# Patient Record
Sex: Female | Born: 1945 | Race: White | Hispanic: No | Marital: Married | Smoking: Current every day smoker
Health system: Southern US, Community
[De-identification: ages and names within clinical notes are randomized; demographics above are authoritative.]

## PROBLEM LIST (undated history)

## (undated) DIAGNOSIS — J449 Chronic obstructive pulmonary disease, unspecified: Secondary | ICD-10-CM

## (undated) DIAGNOSIS — R002 Palpitations: Secondary | ICD-10-CM

## (undated) DIAGNOSIS — M1990 Unspecified osteoarthritis, unspecified site: Secondary | ICD-10-CM

## (undated) DIAGNOSIS — I2510 Atherosclerotic heart disease of native coronary artery without angina pectoris: Secondary | ICD-10-CM

## (undated) DIAGNOSIS — E785 Hyperlipidemia, unspecified: Secondary | ICD-10-CM

## (undated) DIAGNOSIS — M069 Rheumatoid arthritis, unspecified: Secondary | ICD-10-CM

## (undated) DIAGNOSIS — I10 Essential (primary) hypertension: Secondary | ICD-10-CM

## (undated) DIAGNOSIS — K219 Gastro-esophageal reflux disease without esophagitis: Secondary | ICD-10-CM

## (undated) DIAGNOSIS — M797 Fibromyalgia: Secondary | ICD-10-CM

## (undated) DIAGNOSIS — E079 Disorder of thyroid, unspecified: Secondary | ICD-10-CM

## (undated) HISTORY — DX: Essential (primary) hypertension: I10

## (undated) HISTORY — DX: Palpitations: R00.2

## (undated) HISTORY — DX: Chronic obstructive pulmonary disease, unspecified: J44.9

## (undated) HISTORY — DX: Atherosclerotic heart disease of native coronary artery without angina pectoris: I25.10

## (undated) HISTORY — DX: Disorder of thyroid, unspecified: E07.9

## (undated) HISTORY — DX: Unspecified osteoarthritis, unspecified site: M19.90

## (undated) HISTORY — PX: PTCA: SHX146

## (undated) HISTORY — DX: Hyperlipidemia, unspecified: E78.5

## (undated) HISTORY — DX: Gastro-esophageal reflux disease without esophagitis: K21.9

## (undated) HISTORY — DX: Fibromyalgia: M79.7

## (undated) HISTORY — DX: Rheumatoid arthritis, unspecified: M06.9

## (undated) HISTORY — PX: BACK SURGERY: SHX140

---

## 1999-04-26 ENCOUNTER — Encounter: Payer: Self-pay | Admitting: Emergency Medicine

## 1999-04-27 ENCOUNTER — Encounter: Payer: Self-pay | Admitting: Cardiovascular Disease

## 1999-04-27 ENCOUNTER — Observation Stay (HOSPITAL_COMMUNITY): Admission: EM | Admit: 1999-04-27 | Discharge: 1999-04-27 | Payer: Self-pay | Admitting: Emergency Medicine

## 2004-05-14 ENCOUNTER — Emergency Department: Payer: Self-pay | Admitting: Emergency Medicine

## 2005-10-19 ENCOUNTER — Encounter: Admission: RE | Admit: 2005-10-19 | Discharge: 2005-10-19 | Payer: Self-pay | Admitting: Orthopedic Surgery

## 2005-10-20 ENCOUNTER — Encounter (INDEPENDENT_AMBULATORY_CARE_PROVIDER_SITE_OTHER): Payer: Self-pay | Admitting: *Deleted

## 2005-10-20 ENCOUNTER — Ambulatory Visit (HOSPITAL_BASED_OUTPATIENT_CLINIC_OR_DEPARTMENT_OTHER): Admission: RE | Admit: 2005-10-20 | Discharge: 2005-10-20 | Payer: Self-pay | Admitting: Orthopedic Surgery

## 2005-10-22 ENCOUNTER — Emergency Department (HOSPITAL_COMMUNITY): Admission: EM | Admit: 2005-10-22 | Discharge: 2005-10-23 | Payer: Self-pay | Admitting: Emergency Medicine

## 2005-12-22 ENCOUNTER — Inpatient Hospital Stay (HOSPITAL_BASED_OUTPATIENT_CLINIC_OR_DEPARTMENT_OTHER): Admission: RE | Admit: 2005-12-22 | Discharge: 2005-12-22 | Payer: Self-pay | Admitting: Cardiovascular Disease

## 2006-01-17 ENCOUNTER — Encounter: Admission: RE | Admit: 2006-01-17 | Discharge: 2006-01-17 | Payer: Self-pay | Admitting: Neurological Surgery

## 2006-03-23 ENCOUNTER — Ambulatory Visit (HOSPITAL_COMMUNITY): Admission: RE | Admit: 2006-03-23 | Discharge: 2006-03-23 | Payer: Self-pay | Admitting: Pulmonary Disease

## 2008-03-25 ENCOUNTER — Encounter: Admission: RE | Admit: 2008-03-25 | Discharge: 2008-03-25 | Payer: Self-pay | Admitting: Orthopedic Surgery

## 2008-03-26 ENCOUNTER — Emergency Department: Payer: Self-pay | Admitting: Emergency Medicine

## 2008-04-06 ENCOUNTER — Inpatient Hospital Stay (HOSPITAL_BASED_OUTPATIENT_CLINIC_OR_DEPARTMENT_OTHER): Admission: RE | Admit: 2008-04-06 | Discharge: 2008-04-06 | Payer: Self-pay | Admitting: Cardiovascular Disease

## 2008-04-07 ENCOUNTER — Ambulatory Visit (HOSPITAL_COMMUNITY): Admission: RE | Admit: 2008-04-07 | Discharge: 2008-04-08 | Payer: Self-pay | Admitting: Cardiovascular Disease

## 2008-06-08 ENCOUNTER — Observation Stay (HOSPITAL_COMMUNITY): Admission: EM | Admit: 2008-06-08 | Discharge: 2008-06-10 | Payer: Self-pay | Admitting: Emergency Medicine

## 2008-08-12 ENCOUNTER — Emergency Department (HOSPITAL_COMMUNITY): Admission: EM | Admit: 2008-08-12 | Discharge: 2008-08-12 | Payer: Self-pay | Admitting: Emergency Medicine

## 2008-08-28 ENCOUNTER — Ambulatory Visit (HOSPITAL_COMMUNITY): Admission: RE | Admit: 2008-08-28 | Discharge: 2008-08-29 | Payer: Self-pay | Admitting: Cardiovascular Disease

## 2008-09-07 ENCOUNTER — Ambulatory Visit: Payer: Self-pay | Admitting: Vascular Surgery

## 2009-07-26 IMAGING — CT CT CERVICAL SPINE W/O CM
3 of 4 series · 16 of 28 positions shown, 18 images · non-contrast
Comparison: MRI 01/17/2006.

CLINICAL DATA: Neck pain.  Multiple cervical spine surgeries.
Nonunion at C6-7.

CT CERVICAL SPINE WITHOUT CONTRAST
TECHNIQUE: Multidetector CT imaging of the cervical spine was
performed. Multiplanar CT image reconstructions were also
generated.

[Series 2: cervical spine · axial · 0.23mm/px · z∈[+88,+190]mm · 5 of 123 slices shown, 7 images]
[im 21/123  soft-tissue]
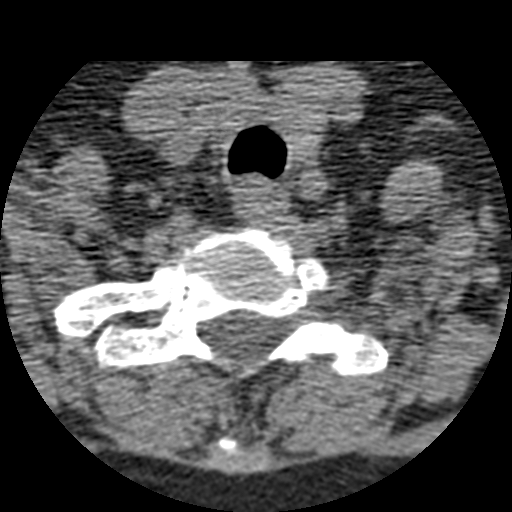
[im 21/123  bone]
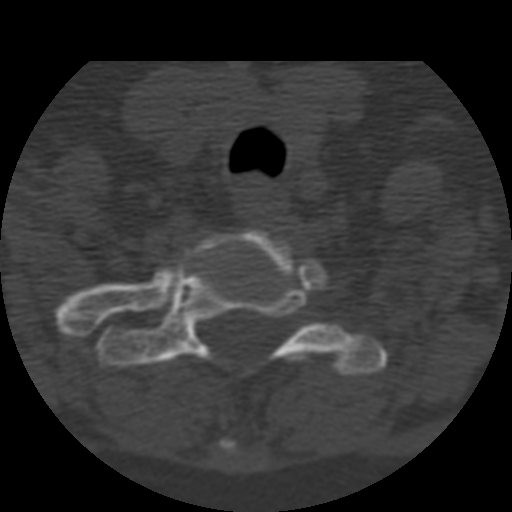
[im 41/123  bone]
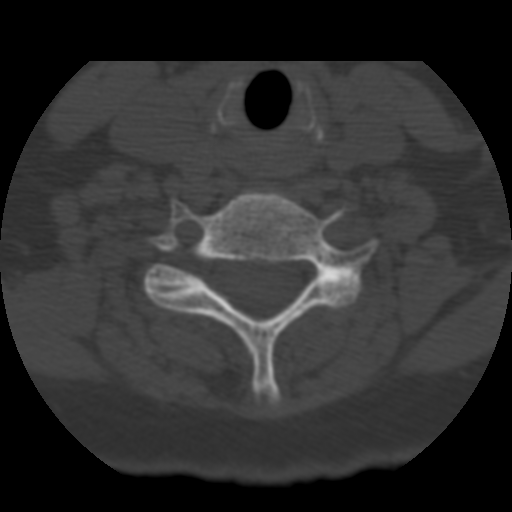
[im 62/123  bone]
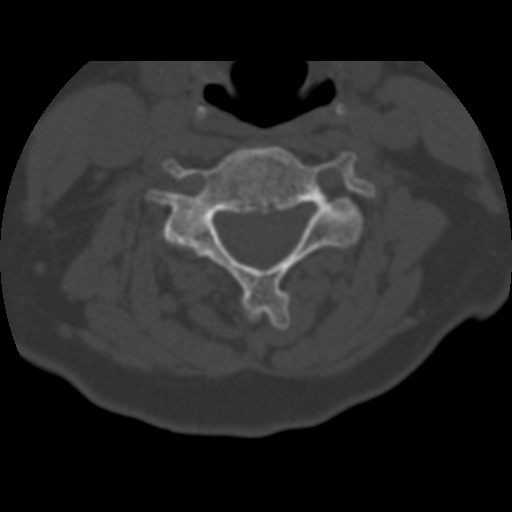
[im 82/123  bone]
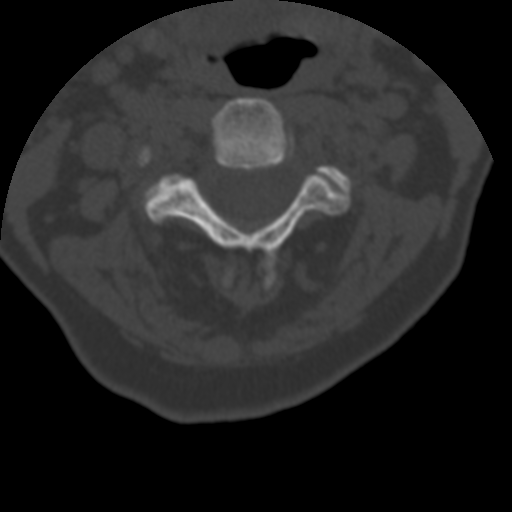
[im 102/123  soft-tissue]
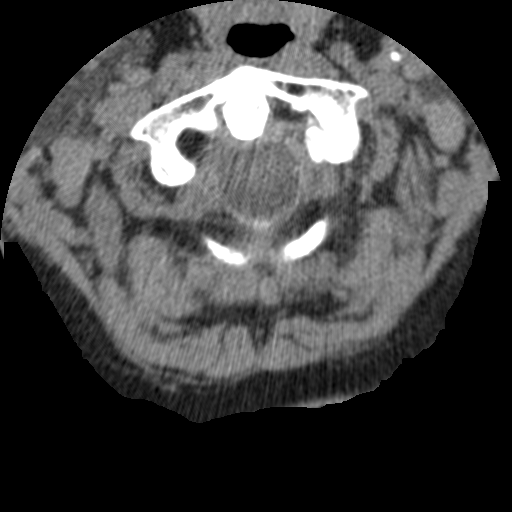
[im 102/123  bone]
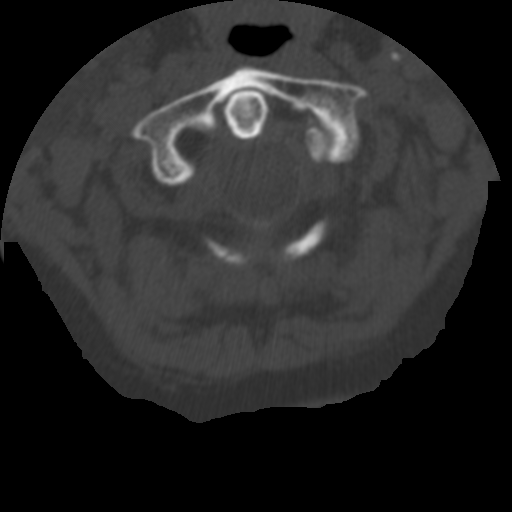

[Series 3: bone windows · axial · 0.23mm/px · z∈[+88,+190]mm · 5 of 123 slices shown]
[im 21/123  bone]
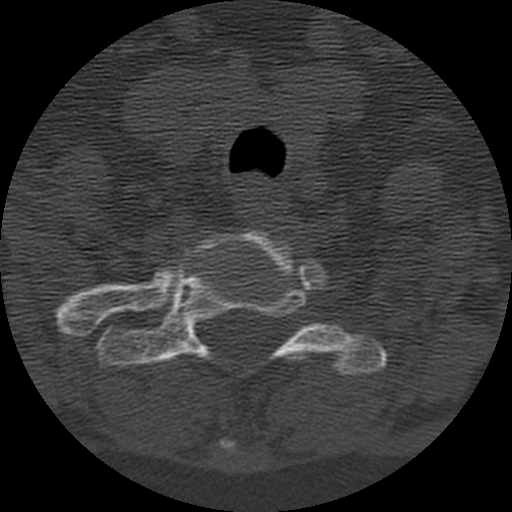
[im 41/123  bone]
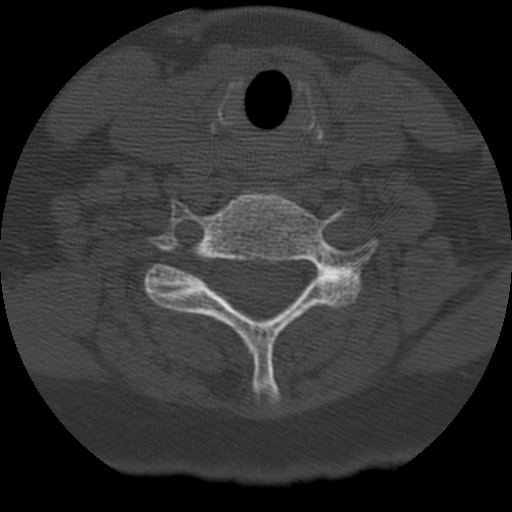
[im 62/123  bone]
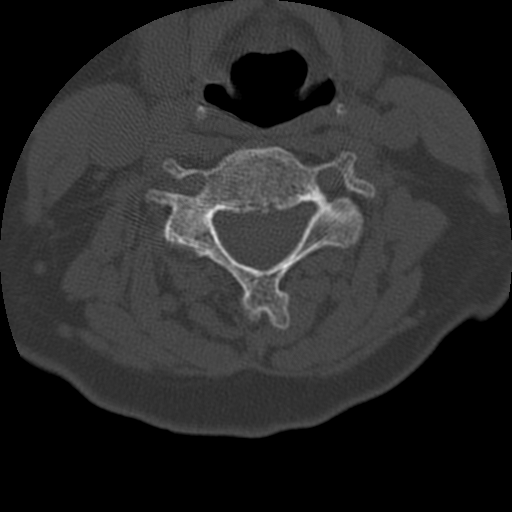
[im 82/123  bone]
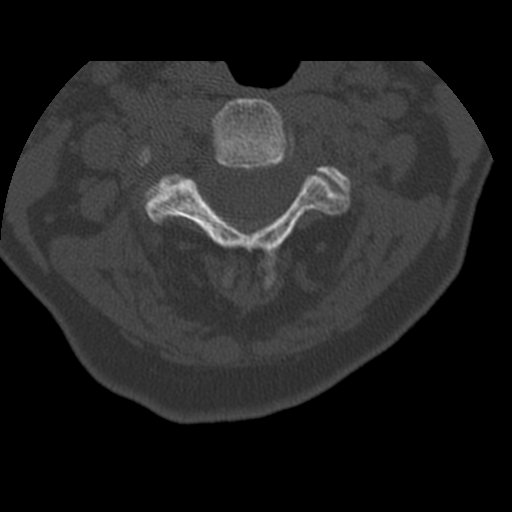
[im 102/123  bone]
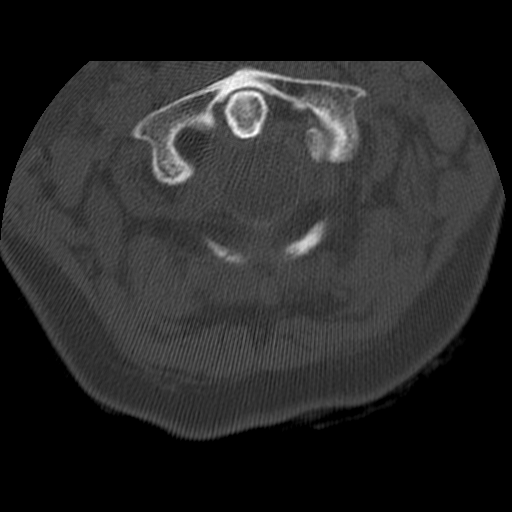

[Series 103: coronal · coronal · 0.31mm/px · 6 of 36 slices shown]
[im 6/36  bone]
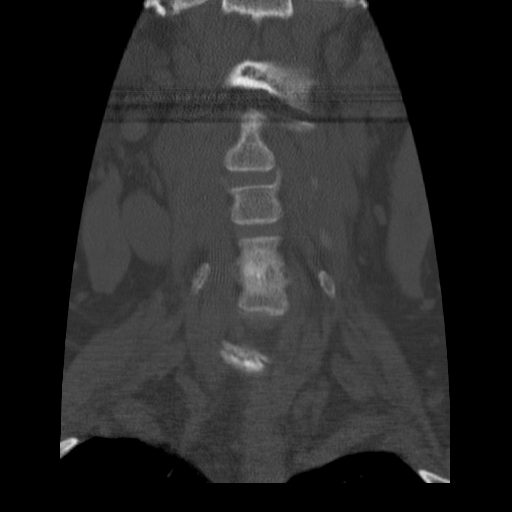
[im 7/36  soft-tissue]
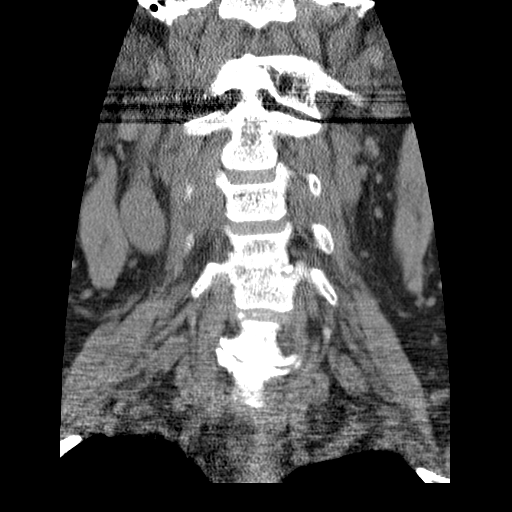
[im 12/36  bone]
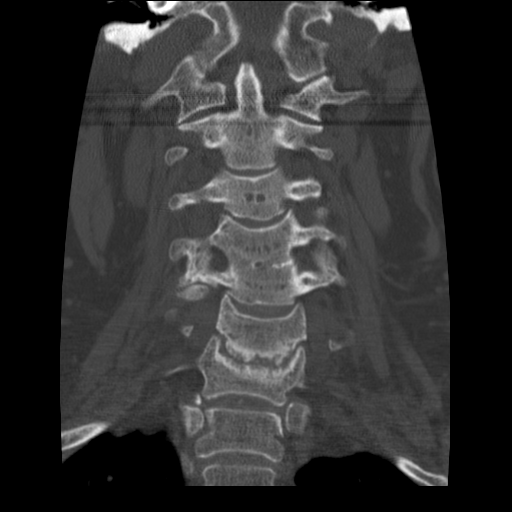
[im 18/36  bone]
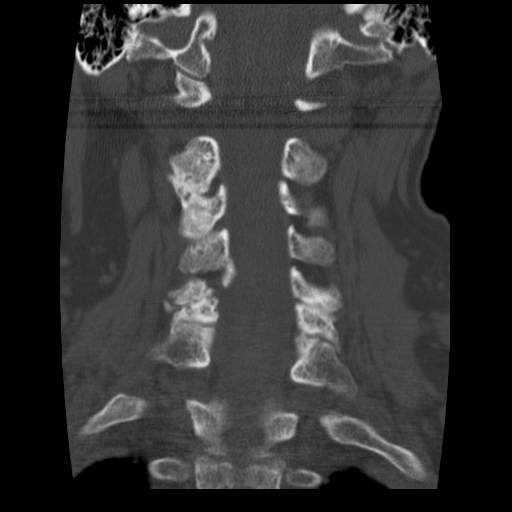
[im 24/36  bone]
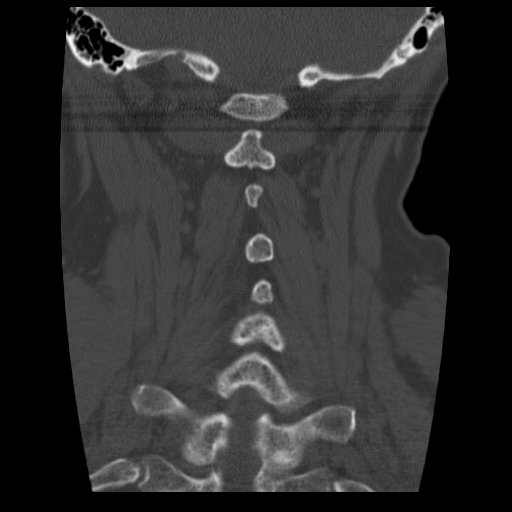
[im 30/36  bone]
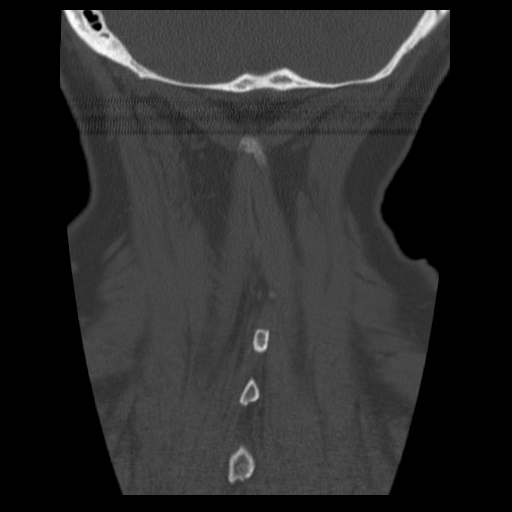

[16 of 28 positions shown; findings below may reference images not displayed]

FINDINGS: The patient is status post fusion at C4-5.  There is
nonunion at C6-7.  Slight anterolisthesis at C5-6 measures 2 mm.
Vertebral body heights and alignment are otherwise maintained.
There is fusion of the posterior elements on the right at C4-5 as
well.  The soft tissues are unremarkable.  Coronal reformats
demonstrate leftward convexity at C6-7.
IMPRESSION: 1.  Nonunion at C6-7 with extensive endplate degenerative changes
and leftward curvature.
2.  Post fusion at C4-5.  This includes the right posterior
elements.
3.  2 mm anterolisthesis at C5-6.

## 2010-04-20 ENCOUNTER — Ambulatory Visit: Payer: Self-pay | Admitting: Cardiovascular Disease

## 2010-06-05 ENCOUNTER — Encounter: Payer: Self-pay | Admitting: Orthopedic Surgery

## 2010-08-09 ENCOUNTER — Other Ambulatory Visit: Payer: Self-pay | Admitting: Cardiovascular Disease

## 2010-08-09 DIAGNOSIS — K219 Gastro-esophageal reflux disease without esophagitis: Secondary | ICD-10-CM

## 2010-08-09 MED ORDER — PRASUGREL HCL 10 MG PO TABS: 10.0000 mg | ORAL_TABLET | Freq: Every day | ORAL | Status: DC

## 2010-08-09 NOTE — Telephone Encounter (Signed)
Patient needs script for EFFIENT updated.  She needs QTY 90 or 3 month supply.  Patient takes script to [facility] in [place] on [address].  Please let patient know.

## 2010-08-09 NOTE — Telephone Encounter (Signed)
Pt requesting refill on   Effient 10mg  daily

## 2010-08-24 LAB — CARDIAC PANEL(CRET KIN+CKTOT+MB+TROPI)
Relative Index: INVALID (ref 0.0–2.5)
Total CK: 73 U/L (ref 7–177)
Troponin I: 0.15 ng/mL — ABNORMAL HIGH (ref 0.00–0.06)

## 2010-08-24 LAB — BASIC METABOLIC PANEL
BUN: 10 mg/dL (ref 6–23)
CO2: 30 mEq/L (ref 19–32)
Calcium: 9.1 mg/dL (ref 8.4–10.5)
GFR calc non Af Amer: >60 mL/min (ref >60)
Glucose, Bld: 93 mg/dL (ref 70–99)
Sodium: 140 mEq/L (ref 135–145)

## 2010-08-24 LAB — CBC
Hemoglobin: 12.2 g/dL (ref 12.0–15.0)
MCHC: 34.7 g/dL (ref 30.0–36.0)
Platelets: 203 10*3/uL (ref 150–400)
RDW: 13.3 % (ref 11.5–15.5)

## 2010-08-24 LAB — PROTIME-INR: INR: 0.8 (ref 0.00–1.49)

## 2010-08-24 LAB — APTT: aPTT: 28 seconds (ref 24–37)

## 2010-08-24 LAB — TROPONIN I: Troponin I: 0.17 ng/mL — ABNORMAL HIGH (ref 0.00–0.06)

## 2010-08-25 LAB — BASIC METABOLIC PANEL
BUN: 11 mg/dL (ref 6–23)
Calcium: 9.5 mg/dL (ref 8.4–10.5)
GFR calc non Af Amer: >60 mL/min (ref >60)
Glucose, Bld: 129 mg/dL — ABNORMAL HIGH (ref 70–99)
Potassium: 4 mEq/L (ref 3.5–5.1)
Sodium: 138 mEq/L (ref 135–145)

## 2010-08-25 LAB — APTT: aPTT: 30 seconds (ref 24–37)

## 2010-08-25 LAB — CBC
Hemoglobin: 13.3 g/dL (ref 12.0–15.0)
Platelets: 244 10*3/uL (ref 150–400)
RDW: 13.1 % (ref 11.5–15.5)
WBC: 7.8 10*3/uL (ref 4.0–10.5)

## 2010-08-25 LAB — DIFFERENTIAL
Basophils Absolute: 0.2 10*3/uL — ABNORMAL HIGH (ref 0.0–0.1)
Lymphocytes Relative: 20 % (ref 12–46)
Lymphs Abs: 1.5 10*3/uL (ref 0.7–4.0)
Neutro Abs: 5.5 10*3/uL (ref 1.7–7.7)

## 2010-08-25 LAB — PROTIME-INR: Prothrombin Time: 11.6 seconds (ref 11.6–15.2)

## 2010-08-29 LAB — CK TOTAL AND CKMB (NOT AT ARMC)
CK, MB: 1 ng/mL (ref 0.3–4.0)
Relative Index: INVALID (ref 0.0–2.5)
Total CK: 55 U/L (ref 7–177)
Total CK: 65 U/L (ref 7–177)

## 2010-08-29 LAB — COMPREHENSIVE METABOLIC PANEL
BUN: 12 mg/dL (ref 6–23)
CO2: 24 mEq/L (ref 19–32)
Calcium: 9.8 mg/dL (ref 8.4–10.5)
Creatinine, Ser: 0.77 mg/dL (ref 0.4–1.2)
GFR calc non Af Amer: >60 mL/min (ref >60)
Glucose, Bld: 110 mg/dL — ABNORMAL HIGH (ref 70–99)

## 2010-08-29 LAB — DIFFERENTIAL
Basophils Relative: 1 % (ref 0–1)
Lymphocytes Relative: 32 % (ref 12–46)
Lymphs Abs: 2.6 10*3/uL (ref 0.7–4.0)
Monocytes Relative: 6 % (ref 3–12)
Neutro Abs: 4.7 10*3/uL (ref 1.7–7.7)
Neutrophils Relative %: 58 % (ref 43–77)

## 2010-08-29 LAB — BASIC METABOLIC PANEL
BUN: 7 mg/dL (ref 6–23)
CO2: 28 mEq/L (ref 19–32)
Chloride: 105 mEq/L (ref 96–112)
GFR calc non Af Amer: >60 mL/min (ref >60)
Glucose, Bld: 106 mg/dL — ABNORMAL HIGH (ref 70–99)
Potassium: 4 mEq/L (ref 3.5–5.1)
Sodium: 139 mEq/L (ref 135–145)

## 2010-08-29 LAB — CBC
HCT: 32.5 % — ABNORMAL LOW (ref 36.0–46.0)
Hemoglobin: 11.2 g/dL — ABNORMAL LOW (ref 12.0–15.0)
Hemoglobin: 13 g/dL (ref 12.0–15.0)
MCHC: 33.4 g/dL (ref 30.0–36.0)
MCHC: 34.4 g/dL (ref 30.0–36.0)
MCV: 90.5 fL (ref 78.0–100.0)
Platelets: 252 10*3/uL (ref 150–400)
RBC: 3.6 MIL/uL — ABNORMAL LOW (ref 3.87–5.11)
RBC: 4.26 MIL/uL (ref 3.87–5.11)
RDW: 13.6 % (ref 11.5–15.5)
RDW: 13.8 % (ref 11.5–15.5)
WBC: 7.2 10*3/uL (ref 4.0–10.5)
WBC: 8.1 10*3/uL (ref 4.0–10.5)

## 2010-08-29 LAB — PROTIME-INR
INR: 0.9 (ref 0.00–1.49)
Prothrombin Time: 12.7 seconds (ref 11.6–15.2)

## 2010-08-29 LAB — TROPONIN I
Troponin I: 0.01 ng/mL (ref 0.00–0.06)
Troponin I: 0.02 ng/mL (ref 0.00–0.06)

## 2010-08-29 LAB — HEPARIN LEVEL (UNFRACTIONATED): Heparin Unfractionated: 0.41 IU/mL (ref 0.30–0.70)

## 2010-08-29 LAB — TSH: TSH: 2.052 u[IU]/mL (ref 0.350–4.500)

## 2010-08-29 LAB — CARDIAC PANEL(CRET KIN+CKTOT+MB+TROPI): Relative Index: INVALID (ref 0.0–2.5)

## 2010-09-27 NOTE — Cardiovascular Report (Signed)
NAMEHECTOR, VENNE NO.:  [id number]   MEDICAL RECORD NO.:  [id number]          PATIENT TYPE:  OIB   LOCATION:  1965                         FACILITY:  [facility]   PHYSICIAN:  Vesta Mixer, M.D. DATE OF BIRTH:  11-04-1945   DATE OF PROCEDURE:  04/06/2008  DATE OF DISCHARGE:  04/06/2008                            CARDIAC CATHETERIZATION   Jasmin [name] is a 65 year old female with a history of coronary artery  disease.  She is status post PTCA of her diagonal artery many years ago.  She is continued to smoke.  She presents now with episodes of chest pain  and scheduled for heart catheterization.   The procedure was left heart catheterization with coronary angiography.  2 mg of Versed was given before the case and 200 mcg of IC nitroglycerin  was given during the case.   The right femoral artery was easily cannulated using a modified  Seldinger technique.   HEMODYNAMICS:  LV pressure is 152/27 with an aortic pressure of 147/70.   ANGIOGRAPHY:  Left main:  The left main is smooth and normal.   The left anterior descending artery has mild irregularities.  It reaches  around the apex.   The first diagonal artery is a very large vessel.  There is a tight 80%  stenosis at its midpoint.  The vessel lumen enlarged some after IC  nitroglycerin.   The left circumflex artery is a moderate-sized vessel.  The first obtuse  marginal artery is very tiny.  There is a 50% stenosis just prior to  giving off the second obtuse marginal artery.   The right coronary artery is very large.  There are mild irregularities.  There is a 60-70% stenosis just after the takeoff of the posterior  descending artery.  It is not clear that this is hemodynamically  significant, although it certainly could be.   The left ventriculogram was performed in the 30 RAO position.  It  reveals overall normal left ventricular systolic function.  Ejection  fraction is around 70-75%.   COMPLICATIONS:  None.   CONCLUSION:  Coronary artery disease primarily involving the first  diagonal artery.  She does also have moderate disease in the circumflex  artery and right coronary artery.  We will bring her back for PCI of the  first diagonal artery.  We may need angioplasty of some of the other  lesions that she continues to have chest pain.      Vesta Mixer, M.D.  Electronically Signed     PJN/MEDQ  D:  04/06/2008  T:  04/07/2008  Job:  [id number]

## 2010-09-27 NOTE — Cardiovascular Report (Signed)
NAMESABRINE, PATCHEN NO.:  [id number]   MEDICAL RECORD NO.:  [id number]          PATIENT TYPE:  OIB   LOCATION:  2502                         FACILITY:  [facility]   PHYSICIAN:  Vesta Mixer, M.D. DATE OF BIRTH:  09-13-1945   DATE OF PROCEDURE:  08/28/2008  DATE OF DISCHARGE:                            CARDIAC CATHETERIZATION   Jasmin [name] is a 65 year old female with a history of coronary artery  disease.  She has had numerous interventions on her diagonal artery for  restenosis.  She presents yesterday to the office with symptoms of  angina.  She was admitted today for outpatient heart catheterization and  possible angioplasty.   The procedure was left heart catheterization with coronary angiography  with PTCA and angioscoping of the first diagonal artery.   The right femoral artery was easily cannulated using modified Seldinger  technique.   HEMODYNAMICS:  LV pressure is 155/14 with an aortic pressure of 149/71.   ANGIOGRAPHY:  The left main is normal.   The left anterior descending artery has minor luminal irregularities.  The first diagonal artery is a moderate-sized vessel.  There is a stent  in the proximal aspect of this vessel.  Just proximal to the stent,  there is a approximately 60% stenosis.  Within the stent there is a 90%  stenosis and this 90% stenosis extends out to just distal to the actual  stent.   There is a small branch that originates from this diagonal artery just  prior to the stent.  There is an 70-80% stenosis in the takeoff of the  small branch.   The left circumflex artery has minor luminal irregularities.   The right coronary artery is smooth and normal.   The left ventriculogram was not performed.  We crossed the valve  pressures.   PCI.  The patient was given Angiomax.  The ACT was 267.  The left main  was engaged using a Judkins left 4 guide.  A Prowater angioplasty wire  was used to place down into the diagonal  artery.  Intracoronary  nitroglycerin was given.   A 2.5 x 10 mm angioscope balloon was positioned across the entire  stenosis.  It was inflated up to 12 atmospheres for 40 seconds on three  different occasions.   We then positioned the angioscope distal to the stent and inflated up to  8 atmospheres on two occasions.   Following this, a 2.5 x 16-mm [place] Voyager was positioned in the distal  aspect of the stent.  It was inflated up to 12 atmospheres for 40  seconds.  It was then positioned in the proximal edge of the stent and  inflated up to 12 atmospheres.  It was then positioned just outside the  stent distally and inflated up to 6 atmospheres for 20 seconds and then  finally 8 atmospheres for 60 seconds.  This resulted in some improvement  of the vessel lumen.  There is still perhaps a 10% residual stenosis.   The patient is stable leaving the lab.   This diagonal artery has become quite problematic.  It is  a very small  vessel and I do not think that we could fit any other stents there.  It  is diffusely diseased proximal and distal to the stent.  Hopefully, this  will stabilize.   The patient continues to smoke.  She does desperately needs to quit  smoking.      Vesta Mixer, M.D.  Electronically Signed     PJN/MEDQ  D:  08/28/2008  T:  08/29/2008  Job:  [id number]

## 2010-09-27 NOTE — H&P (Signed)
NAMEELIANIS, FISCHBACH NO.:  [id number]   MEDICAL RECORD NO.:  [id number]          PATIENT TYPE:  OIB   LOCATION:                               FACILITY:  [facility]   PHYSICIAN:  Vesta Mixer, M.D. DATE OF BIRTH:  Jul 07, 1945   DATE OF ADMISSION:  08/28/2008  DATE OF DISCHARGE:                              HISTORY & PHYSICAL   Jasmin [name] is a middle-aged female with a history of coronary artery  disease.  She is status post PTCA and stenting of her diagonal artery in  January of this past year.  She actually had a cutting balloon  angioplasty of the same lesion in November, and the stent placement was  due to restenosis.  She presents with episodes of chest discomfort for  the past day or so.  These episodes of chest pain are described as a  burning pressure sensation.  This was centered in the middle of her  chest and radiated out to her arms.  It was exacerbated by movement.  It  radiated up to her neck and throat and to her year.  It was associated  with some nausea and diaphoresis.  It was also associated with some arm  weakness.  It resolved after 2 nitroglycerins.   She has also had some problems that sound like lower extremity arterial  insufficiency.  She has had some pallor and some discoloration.  She has  also had tingling.  This episode occurred months after her heart  catheterization, so I do not think it was due to atheroemboli.  She is  scheduled to see Dr. Hart Rochester and she was actually scheduled to see him  last week, but he had to leave on an emergency.  She is scheduled to see  him in a week or so.   She denies any heat or cold intolerance.  She denies any syncope or  presyncope.   CURRENT MEDICATIONS:  1. Aspirin 325 mg a day.  2. Nitroglycerin as needed.  3. Lisinopril 10 mg a day.  4. Metoprolol slow release 50 mg a day.  5. Multivitamin once a day.  6. Crestor 20 mg a day.  7. Plavix 75 mg a day.  8. Pepcid once a day.   ALLERGIES:   None.   PAST MEDICAL HISTORY:  1. Coronary artery disease.  She is status post PTCA and stenting of      her first diagonal artery.  Her other coronary arteries have minor      luminal irregularities.  2. Hypertension.   FAMILY HISTORY:  Positive for cardiac disease.   SOCIAL HISTORY:  The patient smokes about a half-pack of cigarettes a  day.   Her review of systems was reviewed in the HPI.  All other systems were  reviewed and are negative.   On exam, she is a middle-aged female in no acute distress.  She is alert  and oriented x3, and her mood and affect are normal.  Her weight is 170, blood pressure is 132/82 with heart rate of 79.  HEENT:  Reveals 2+ carotids.  She  has no bruits, no JVD, no thyromegaly.  Her neck is supple.  Her sclerae are nonicteric.  Her mucous membranes  are moist.  LUNGS:  Clear.  BACK:  Nontender.  HEART:  Regular rate, S1, S2.  PMI is nondisplaced.  ABDOMEN:  Reveals good bowel sounds and is nontender.  EXTREMITIES:  She has no clubbing, cyanosis or edema.  Her skin is  without rashes.  Her pulses are intact.  Her gait is normal.   Her EKG reveals normal sinus rhythm.  She has no ST or T-wave changes.   Jasmin [name] presents with recurrent episodes of angina.  We discussed the  risks, benefits and options of repeating her heart catheterization.  We  will schedule her for a left __________ tomorrow at 9 o'clock.      Vesta Mixer, M.D.  Electronically Signed     PJN/MEDQ  D:  08/27/2008  T:  08/27/2008  Job:  [id number]   cc:   Ramon Dredge L. Juanetta Gosling, M.D.

## 2010-09-27 NOTE — Discharge Summary (Signed)
Jasmin [name], Jasmin [name]               ACCOUNT NO.:  [id number]   MEDICAL RECORD NO.:  [id number]          PATIENT TYPE:  INP   LOCATION:  2504                         FACILITY:  [facility]   PHYSICIAN:  Vesta Mixer, M.D. DATE OF BIRTH:  Dec 31, 1945   DATE OF ADMISSION:  06/08/2008  DATE OF DISCHARGE:  06/10/2008                               DISCHARGE SUMMARY   DISCHARGE DIAGNOSES:  1. Coronary artery disease - status post percutaneous transluminal      coronary angioplasty and cutting balloon procedure to the first      diagonal artery.  2. Hyperlipidemia.  3. Hypertension.   DISCHARGE MEDICATIONS:  1. Toprol-XL 50 mg a day.  2. Crestor 20 mg at bedtime.  3. Plavix 75 mg a day.  4. Aspirin 81 mg a day.  5. Lisinopril 10 mg a day.  6. Nitroglycerin 0.4 mg sublingually as needed for chest pain.   DISPOSITION:  The patient will see Dr. Elease Hashimoto in the office in 1-2  weeks.   HISTORY:  [name] is a 65 year old female with a recent history of a  stent placement to her first diagonal artery.  She was admitted with  episodes of chest pain.  Please see dictated H&P for further details.   HOSPITAL COURSE:  1. Chest pain.  The patient was admitted.  She had no EKG changes.      She had had a previous Cardiolite study, which revealed a defect in      the anterolateral wall.  The defect was partially fixed and      partially reversible.  She ruled out for myocardial infarction with      serial CPKs.  On the following day, she had a heart catheterization      which revealed a tight 95% stenosis of her diagonal artery just      prior to the stent and 80-90% stenosis within the stent.  She      underwent successful angioplasty using an angioscope balloon.  We      used a 2.5-mm balloon extended up to approximately 2.7-2.8 mm.  She      tolerated the procedure quite well.  We were able to obtain a very      nice result with 0% residual stenosis.  The patient tolerated the      procedure and  enzymes were negative following the procedure.  She      will be discharged now in satisfactory condition.  She did have      some mild hypertension which was treated with p.r.n. labetalol.      She will be followed by Dr. Elease Hashimoto for this.  All of her other      medical problems are stable and will be addressed later in the      hospital.      Vesta Mixer, M.D.  Electronically Signed     PJN/MEDQ  D:  06/10/2008  T:  06/10/2008  Job:  [id number]

## 2010-09-27 NOTE — Cardiovascular Report (Signed)
Jasmin, [name] NO.:  [id number]   MEDICAL RECORD NO.:  [id number]          PATIENT TYPE:  INP   LOCATION:  2504                         FACILITY:  [facility]   PHYSICIAN:  Vesta Mixer, M.D. DATE OF BIRTH:  11-27-1945   DATE OF PROCEDURE:  06/09/2008  DATE OF DISCHARGE:                            CARDIAC CATHETERIZATION   Jasmin [name] is a 65 year old female with a history of coronary artery  disease.  She is status post PTCA and stenting of her first diagonal  artery in October 2009.  She presents with episodes of chest pain.  She  ruled out for myocardial infarction.  She is referred to the Cath Lab  for further evaluation.   PROCEDURES:  1. Left heart catheterization (we did not perform a left      ventriculogram).  2. She also had percutaneous transluminal coronary angioplasty and      angioscoping of her first diagonal artery.   The right femoral artery was easily cannulated using a modified  Seldinger technique.   HEMODYNAMIC:  Monitor revealed a central aortic pressure of 150/75.   ANGIOGRAPHY:  Left main.  The left main has minor luminal  irregularities.  There is mild amount of calcification.   The left anterior descending artery has mild irregularities proximally.  There is moderate disease in the mid segment between 10 and 20%.  The  distal LAD has only minor luminal irregularities.   The first diagonal artery is a moderate-sized vessel.  There is a 90%  stenosis just prior to the onset of the stent.  There is an 80-90%  stenosis within the stent.  Following the stent, the vessel is fairly  normal.  It appears to be between 2.0 and 2.5 mm in diameter.  There is  TIMI grade 2 flow down the diagonal vessel.   The left circumflex artery is a moderate-sized system.  It primarily  consisting of one obtuse marginal artery.  There is a 30% stenosis in  the mid vessel.   The right coronary artery is moderate to large in size and is  dominant.  There is a mid 10-20% stenosis.   No left ventriculogram was performed, because we performed one just  several months ago.   PERCUTANEOUS CORONARY INTERVENTION:  The patient was given Angiomax.  ACT was 365.  The left main was cannulated using a Judkins left 4 guide.  A Prowater angioplasty wire was used to cannulate the first diagonal  artery.  Initially, we tried to pass a 2.5 x 10 mm angioscope balloon,  but this would not pass through the stenosis.  Pre-dilatation was  achieved using a 2.0 x 15 mm Apex.  It was taken down into the stenosis  and inflated up to 6 atmospheres on 2 occasions.   At this point, the 2.5 x 10 mm angioscope was positioned down across the  stenosis.  A series of inflations were performed between 6 atmospheres  and 12 atmospheres.  The final 12-atmosphere inflation was in the middle  of the stent.  We performed an 8 atmosphere on each end of the  stent.  This resulted in a very nice angiographic result.  We were able to  reduce the stenosis from 90% down to approximately 0%.  There is TIMI  grade 3 flow down through the diagonal vessel.  There were no obvious  complications.   CONCLUSION:  1. Successful percutaneous transluminal coronary      angioplasty/angioscoped of the first diagonal artery.  2. She has mild-to-moderate coronary artery irregularities elsewhere.      She received Angiomax for 2 hours.  She will continue on Plavix.      Vesta Mixer, M.D.  Electronically Signed     PJN/MEDQ  D:  06/09/2008  T:  06/10/2008  Job:  [id number]

## 2010-09-27 NOTE — Discharge Summary (Signed)
NAMESHEREEN, Jasmin [name]               ACCOUNT NO.:  [id number]   MEDICAL RECORD NO.:  [id number]          PATIENT TYPE:  INP   LOCATION:  6527                         FACILITY:  [facility]   PHYSICIAN:  Vesta Mixer, M.D. DATE OF BIRTH:  03/19/1946   DATE OF ADMISSION:  04/07/2008  DATE OF DISCHARGE:  04/08/2008                               DISCHARGE SUMMARY   DISCHARGE DIAGNOSES:  1. Coronary artery disease - status post percutaneous transluminal      coronary angioplasty and stenting of her first diagonal artery.      She has moderate irregularities involving her circumflex vessel and      right coronary artery.  2. Hypertension.  3. Dyslipidemia.  4. Ongoing cigarette smoking.   DISCHARGE MEDICATIONS:  1. Aspirin 325 mg a day.  2. Plavix 75 mg a day.  3. Lisinopril 10 mg a day.  4. Metoprolol XL 50 mg a day.  5. Nitroglycerin 0.4 mg sublingually as needed.  6. Crestor 20 mg a day.   DISPOSITION:  The patient will see Dr. Elease Hashimoto in the office in a week or  two.   HISTORY:  [name] is a 65 year old female with a history of coronary  artery disease.  She recently presented with episodes of chest pain.  Please see dictated H&P for further details.   HOSPITAL COURSE:  Chest pain.  The patient had a diagnostic heart  catheterization, which revealed a tight 80% stenosis in her first  diagonal artery.  She was also found to have some moderate  irregularities in her left circumflex and her right coronary artery.  We  decided to admit her for PTCA.  We were successful in placing a 2.5 x 18-  mm mini Vision stent.  It was deployed at 10 atmospheres.  Poststent  dilatation was achieved using a 2.5 x 15-mm Voyager [place].  She tolerated  the procedure quite well and there were no EKG changes.   The stent was a non drug-eluting stent.  We placed a non-DES because of  her need for neck surgery in the next couple of months.  She should be  safe to continue Plavix for 2 months and then  temporarily stop it for  her neck surgery.  I would like to get her back on Plavix following  that.  All of her medical problems are stable and she is discharged in  satisfactory condition.      Vesta Mixer, M.D.  Electronically Signed     PJN/MEDQ  D:  04/08/2008  T:  04/08/2008  Job:  [id number]

## 2010-09-27 NOTE — H&P (Signed)
NAMEASSUNTA, PUPO NO.:  [id number]   MEDICAL RECORD NO.:  [id number]           PATIENT TYPE:   LOCATION:                                 FACILITY:   PHYSICIAN:  Vesta Mixer, M.D.      DATE OF BIRTH:   DATE OF ADMISSION:  04/06/2008  DATE OF DISCHARGE:                              HISTORY & PHYSICAL   HISTORY OF PRESENT ILLNESS:  Jasmin [name] is a middle-aged female with  a history of coronary artery disease.  She is status post PTCA and  stenting of her first diagonal artery back in the years ago.  She is now  admitted for heart catheterization for further episodes of chest pain.   Jasmin [name] is a middle-aged female with a history of hyperlipidemia and  coronary artery disease.  She is status post PTCA of her first diagonal  artery back in 1994.  Repeat heart catheterization in 2007 revealed  minor luminal irregularities in the LAD and the diagonal artery.  There  was no evidence of restenosis.   She has moderate disease in the left circumflex artery in the right  coronary artery.  She had normal left ventricular systolic function with  an ejection fraction of 70-75%.   She was recently seen for episodes of chest pain.  These occur at  various times.  They are not necessarily related to exercise.  They last  for 5 or 10 minutes.  They tend to radiate through to her back.  Last  night she had chest pain that was the worst ever.  It was associated  with some palpitations, some nausea, and some diaphoresis.  She felt a  little bit better today.   CURRENT MEDICATIONS:  1. Lisinopril 10 mg a day.  2. Metoprolol slow-release 50 mg.  3. Multivitamin once a day.  4. Crestor once a day.  5. Antibiotic for 10 days.   ALLERGIES:  None.   PAST MEDICAL HISTORY:  1. Hyperlipidemia.  2. Coronary artery disease - status post PTCA of her first diagonal      artery.   SOCIAL HISTORY:  The patient smokes about a half pack of cigarettes a  day.   FAMILY  HISTORY:  Positive for cardiac disease.   REVIEW OF SYSTEMS:  She denies any cough or cold or sputum production.  She denies any fevers, chills, weight gain, or weight loss.  She denies  any heat or cold intolerance.  She denies any visual problems or hearing  problems.  She denies any muscle stiffness or aches and pains.  She  denies any GU or GI problems.  Review of systems was reviewed and is  otherwise negative.   PHYSICAL EXAMINATION:  GENERAL:  She is an elderly female in no acute  distress.  She is alert and oriented x3.  Mood and affect are normal.  VITAL SIGNS:  Weight is 166 which is down 4 pounds.  Blood pressure is  120/70 with heart rate of 74.  HEENT:  Reveals 2+ carotids.  She has no bruits.  No JVD.  No  thyromegaly.  LUNGS:  Clear.  Her chest wall expansion is normal.  NECK:  Supple.  There is no lymphadenopathy.  HEART:  Regular rate, S1 and S2.  There is no chest wall tenderness.  ABDOMEN:  Good bowel sounds.  There is no hepatosplenomegaly.  Her  abdomen is nontender.  EXTREMITIES:  She has good pulses.  There is no clubbing or cyanosis.  Her gait is normal.  SKIN:  Warm and dry and there is no rash.  NEUROLOGIC:  Cranial nerves II through XII are intact and her motor and  sensory function were intact.   Her EKG reveals normal sinus rhythm.  She has no ST or T-wave changes.   ASSESSMENT/PLAN:  Lakendra presents with episodes of chest pain that are  fairly persistent.  Given the nature of these chest pains, I have  elected to proceed with heart catheterization.  We have discussed the  risks, benefits, and options of heart catheterization.  She understands  and agrees to proceed.      Vesta Mixer, M.D.  Electronically Signed     PJN/MEDQ  D:  04/01/2008  T:  04/02/2008  Job:  [id number]

## 2010-09-27 NOTE — Consult Note (Signed)
NEW PATIENT CONSULTATION   Jasmin [name], Jasmin [name]  DOB:  12-23-1945                                       09/07/2008  EAVWU#:[id number]   The patient is a 65 year old female referred by the emergency room  physician and Dr. Juanetta Gosling for leg discomfort.  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  She has a  long history of discomfort in both lower extremities, beginning in the  hips, extending down into the thighs and eventually the calves.  She is  able to walk about 1 block or so and stops because of a combination of  leg discomfort, back discomfort and dyspnea.  She also has a cardiac  history and has had a recent redo PTCA and stent performed by Dr. Elease Hashimoto  in November 2009 and has had 2 further interventions since that time, 1  last week.  She has no history of infection, gangrene, nonhealing ulcers  or rest pain in the lower extremities.   PAST MEDICAL HISTORY:  1. Hypertension.  2. Hyperlipidemia.  3. Coronary artery disease.  4. Negative for COPD, diabetes or stroke.   PAST SURGICAL HISTORY:  1. Multiple procedures on the lumbar and cervical spine including      fusions.  2. Hysterectomy.  3. Cholecystectomy.  4. Multiple orthopedic procedures on the feet and hips.  5. TMJ surgery.   FAMILY HISTORY:  Positive for coronary artery disease in multiple family  members at a young age including mother, brother, and the father at a  more elderly age.  Diabetes in the father.  Negative for stroke.   SOCIAL HISTORY:  She is married and is retired.  She has smoked  intermittently all of her life, she states no more than 1/2 to 3/4 of a  pack of cigarettes per day, quit 2 weeks ago.  Does not use alcohol.   REVIEW OF SYSTEMS:  Positive for multiple complaints including chest  pain, dyspnea on exertion, arrhythmias, peptic ulcer disease, reflux  esophagitis,  lower extremity discomfort, dizziness, headaches and joint  pain.   ALLERGIES:  None known.   MEDICATIONS:  Please see health history exam.   PHYSICAL EXAMINATION:  Blood pressure is 130/74, heart rate 75,  respirations 14.  General:  She is a female patient in no apparent  distress, alert and oriented x3.  Neck:  Supple, 3+ carotid pulses  palpable.  No bruits are audible.  Neurologic:  Normal.  No palpable  adenopathy in the neck.  Chest:  Clear to auscultation.  Cardiovascular:  Regular rhythm, no murmurs.  Abdomen:  Soft, nontender with no masses.  She has 3+ femoral, popliteal and 2+ to 3+ posterior tibial pulses  palpable bilaterally.  She has some diffuse spider veins and reticular  veins, no distal edema or varicosities noted.   Lower extremity arterial Dopplers revealed ABIs between 0.91 and 0.95  bilaterally, with biphasic flow, with slight decrease with exercise.   She does have some mild occlusive disease in her lower extremities but I  think her symptoms are primarily due to her lumbar disk disease.  Do not  recommend any further evaluation or treatment of her lower extremity  vasculature since it is certainly adequate.  I have counseled her  on  risk reduction.  Will see her again on a p.r.n. basis.   Quita Skye Hart [place], M.D.  Electronically Signed   JDL/MEDQ  D:  09/07/2008  T:  09/08/2008  Job:  [id number]   cc:   Ramon Dredge L. Juanetta Gosling, M.D.

## 2010-09-27 NOTE — H&P (Signed)
Jasmin, [name] NO.:  [id number]   MEDICAL RECORD NO.:  [id number]          PATIENT TYPE:  INP   LOCATION:  2928                         FACILITY:  [facility]   PHYSICIAN:  Vesta Mixer, M.D. DATE OF BIRTH:  [birth year]   DATE OF ADMISSION:  06/08/2008  DATE OF DISCHARGE:                              HISTORY & PHYSICAL   Jasmin [name] is a middle-aged female with a history of coronary artery  disease.  She is status post PTCA and stenting of her first diagonal  artery 2 months ago.  She has a history of hyperlipidemia, hypertension.  She is now admitted to the hospital with symptoms consistent with  unstable angina.   [name] has a history of coronary artery disease.  She is status post  PTCA of her diagonal artery back in 1994.  She has had done fairly well  but presented with episodes of chest pain several months ago.  She had a  heart catheterization, which revealed re-stenosis of her diagonal  artery.  She had successful stenting of the diagonal artery using a 2.5  x 18 mm mini-Vision stent.  It was post-dilated using a 2.5 mm [place]  Voyager up to 12 atmospheres.  We chose a non-drug-eluting stent because  the patient had been having some neck pain and was actually scheduled to  have neck surgery in the next several months.   She now presents with recurrent episodes of chest pain.  The pain is  described as a pressure in the center of her chest.  It radiates through  to her back.  It is associated with some shortness of breath.  She has  taken 10 nitroglycerin over the weekend.  She had a stress Cardiolite  study, which reveals what appears to be a fixed anterior defect with a  very small amount of reversibility.  The stress images of the study were  performed last week, and she finally obtained the resting images today  so that we were unable to read it.   She had been having chest pain over the [facility], and she was brought  over to the office to be  evaluated.   The patient received additional nitro and was basically pain free as  long as she sat still.  She denies any syncope or presyncope.  She  denies any nausea or vomiting.  She has had some diaphoresis.  She  denies any PND or orthopnea.  She denies any weight gain or weight loss.   CURRENT MEDICATIONS:  1. Aspirin 325 mg a day.  2. Lisinopril 10 mg a day.  3. Metoprolol slow release 50 mg a day.  4. Multivitamin once a day.  5. Crestor 20 mg a day.  6. Plavix 75 mg a day.  7. Pepcid once a day.   ALLERGIES:  None.   PAST MEDICAL HISTORY:  1. Coronary artery disease.  She is status post PTCA and stenting of      her first diagonal artery.  2. History of hypertension.  3. Hyperlipidemia.   SOCIAL HISTORY:  The patient continues to smoke  about half a pack of  cigarettes a day.   FAMILY HISTORY:  Family history is positive for cardiac disease.   Review of systems is reviewed in the history of present illness.  All  other systems were reviewed and are negative.   PHYSICAL EXAMINATION:  General:  She is a middle-aged female in no acute  distress.  She is having some mild chest discomfort.  VITAL SIGNS:  Her blood pressure is 122/80 with a heart rate of 72.  HEENT:  She is normocephalic and atraumatic.  NECK:  Supple.  Her carotids are 2+ without bruits heard.  There is no  JVD, no thyromegaly.  LUNGS: Clear.  BACK:  Nontender.  HEART:  Regular rate,  S1 and S2.  ABDOMEN:  Good bowel sounds, and there is no hepatosplenomegaly.  There  are no masses or bruits.  EXTREMITIES:  She has good femoral pulses.  Her distal pulses are  intact.  She has no clubbing, cyanosis, or edema.  SKIN:  Warm and dry and without rashes.  NEURO:  Nonfocal.  Cranial nerves II through XII are intact, and her  motor and sensory function are intact.  Her gait is fairly normal.   Her stress Cardiolite study reveals a defect in the anterior wall that  appears to be mostly fixed.  There is a  small degree of reversibility.   [name] presents with a fixed anterior defect that certainly could be due  to closure of her stent from 2 months ago.  Also could be due to breast  artifact.  She is having symptoms consistent with unstable angina.  We  will go ahead and admit her to the hospital and start her on IV  nitroglycerin and IV heparin.  We will anticipate doing a heart  catheterization tomorrow.  We have discussed the risks, benefits, and  options of heart catheterization.  She understands and agrees to  proceed.      Vesta Mixer, M.D.  Electronically Signed     PJN/MEDQ  D:  06/08/2008  T:  06/09/2008  Job:  [id number]   cc:   Ramon Dredge L. Juanetta Gosling, M.D.

## 2010-09-27 NOTE — Cardiovascular Report (Signed)
NAMETOVA, VATER NO.:  [id number]   MEDICAL RECORD NO.:  [id number]          PATIENT TYPE:  INP   LOCATION:  6527                         FACILITY:  [facility]   PHYSICIAN:  Vesta Mixer, M.D. DATE OF BIRTH:  [birth year]   DATE OF PROCEDURE:  04/07/2008  DATE OF DISCHARGE:                            CARDIAC CATHETERIZATION   Jasmin [name] is a 65 year old female with a history of coronary artery  disease.  She is status post PTCA of her first diagonal artery many  years ago.  She is continued to smoke.  She presented recently with  several episodes of chest discomfort.  She had a diagnostic heart  catheterization which revealed a tight first diagonal artery.  She also  was found to have moderate circumflex stenosis and right coronary artery  stenosis.  She was referred for PTCA and stenting of her first diagonal  stenosis.   The patient is having severe neck pain and is tentatively scheduled to  have neck surgery in a couple of months.  Given this consideration, we  have planned to put a non drug-eluting stent in, so that she will be  able to stop her Plavix when needed in January.   Procedure was left heart catheterization with PTCA and stenting of the  first diagonal artery.  The right femoral artery was easily cannulated  using a modified Seldinger technique.   HEMODYNAMICS:  Aortic pressure is 159/71.   PROCEDURE:  A Judkins 6-French left 4 guide was positioned up into the  left main.  Angiography revealed mild-to-moderate disease involving the  left anterior descending artery.  The first diagonal artery is a  moderate-sized vessel and is probably between 2.25 and 2.5 mm in  diameter.  There is an 80% stenosis at the midpoint.   Angiomax was given.  The ACT was 313.   A [place] angioplasty wire was passed down into the first diagonal  artery.  A 2.0 x 12-mm apex was positioned across the stenosis and was  inflated up to 6 atmospheres for 30  seconds.  This improved the vessel  appearance.  Following this, a 2.5 x 18-mm mini Vision stent was  positioned across the stenosis and was deployed at 10 atmospheres for 30  seconds.   Post-stent dilatation was achieved using a 2.5 x 15-mm Voyager [place].  We  positioned it distally and the stent inflated up to 12 atmospheres for  15 seconds.  We then pulled it back to the proximal edge of the stent  and inflated it up to 12 atmospheres for 15 seconds.  This gave [place] a  very nice angiographic result.  There is a nice step-up and step-down,  but no evidence of edge dissection.   The patient did not have any chest pain during this procedure.   COMPLICATIONS:  None.   CONCLUSIONS:  Successful PTCA and stenting of the first diagonal artery.  The patient will need Plavix every day for at least a month and we will  try to keep her on it for several months following that.       [name]  [name], M.D.  Electronically Signed     PJN/MEDQ  D:  04/07/2008  T:  04/08/2008  Job:  [id number]

## 2010-09-30 NOTE — Op Note (Signed)
[name], [name]               ACCOUNT NO.:  [id number]   MEDICAL RECORD NO.:  [id number]          PATIENT TYPE:  AMB   LOCATION:  DSC                          FACILITY:  [facility]   PHYSICIAN:  Katy Fitch. Sypher, M.D. DATE OF BIRTH:  07/22/1945   DATE OF PROCEDURE:  10/20/2005  DATE OF DISCHARGE:                                 OPERATIVE REPORT   PREOPERATIVE DIAGNOSES:  1.  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  2.  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  3.  Painful cyst left long finger A1 pulley.   POSTOPERATIVE DIAGNOSES:  1.  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  2.  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  3.  Painful cyst left long finger A1 pulley.   OPERATION:  1.  Resection of a complex giant-cell tumor from the right ring finger pulp,      ulnar aspect deep to the flexor digitorum profundus tendon, in the DIP      joint, and along the dorsal ulnar aspect of the distal interphalangeal      joint.  2.  Release of left first dorsal compartment.  3.  Excision of left long finger A1 pulley ganglion.   OPERATION SURGEON:  Dr. Molly Maduro Sypher   ASSISTANT:  Annye Rusk PA-C.   ANESTHESIA:  General by LMA.   SUPERVISING ANESTHESIOLOGIST:  Dr. Gelene Mink   INDICATIONS:  Jasmin [name] is a 65 year old woman referred through the  courtesy of Dr. Pollyann Savoy.  She has had a history of prior cyst  formation and right first dorsal compartment stenosing tenosynovitis treated  in 1993 with surgical release.   She presented for evaluation of multiple problems affecting both hands at  Dr. Fatima Sanger suggestion.   She had had a biopsy of her right ring finger performed by Dr. Reita Chard  in [place], revealing a giant cell tumor.  Unfortunately, shortly after  her biopsy, she noted a persistent and/or recurrent mass in her ring finger  and sought a hand surgery consult in [place].  In addition, she noted  severe pain over her left first dorsal compartment and a painful mass  overlying her left long finger A1 pulley.   After informed consent, she is brought to the operating room at this time.   PROCEDURE:  Jasmin [name] is brought to the operating room and placed in  supine position upon the operation table.   Following the induction general anesthesia under Dr. Thornton Dales  supervision, the right and left arms were prepped with Betadine soap  solution and sterilely draped.  A pneumatic tourniquet was applied at the  proximal right forearm and the proximal left brachium.   Following exsanguination of the right hand and forearm with an Esmarch  bandage, the arterial tourniquet was inflated to 220 mmHg.  The procedure  commenced  with a midline incision in the ring finger pulp that was extended  along the DIP flexion crease towards the midline.  The ulnar neurovascular  bundle was identified, and a complex multilobular giant-cell tumor was noted  in the central pulp, ulnar pulp, infiltrating through the flexor digitorum  profundus into the DIP joint and deep to the ulnar collateral ligament.   The mass was meticulously dissected, taking care to preserve the ulnar  proper digital artery and nerve.   A dorsal mass was noted.  A transverse incision was fashioned dorsally  adjacent to the extensor mechanism.  A giant cell tumor was noted be  infiltrating dorsal to the ulnar collateral ligament at the DIP joint and  deep to the extensor mechanism.   This was circumferentially dissected with a fine periosteal elevator and  removed with a rongeur.   Electrocautery was used to desiccate the bed of the mass in an effort to try  to prevent recurrence.   The  wounds were irrigated and repaired with a mattress suture of 5-0 nylon.  A digital block was placed with 2% lidocaine.  The wounds were dressed with  Xeroflow sterile gauze and Coban.  There were no apparent complications.   The tourniquet was released on the right side and attention directed to the  left side.   The left arm was exsanguinated with an Esmarch bandage and arterial  tourniquet on the proximal brachium inflated to 220 mmHg.  The first dorsal  compartment was approached through a short transverse incision.  Subcutaneous tissues were carefully divided revealing a severely thickened  first dorsal compartment with inflammatory synovitis extruding proximal to  the compartment.   The radial sensory branches were carefully isolated and gently retracted  with blunt right-angle retractors.  The compartment was split with a scalpel  and scissors.  Three tendons were noted.  Two large slips of the abductor  pollicis longus were noted to have considerable remodeling due to  compression, and a single slip of the extensor pollicis brevis was likewise  released.  There was no septum noted.   Abundant synovitis proximal to the compartment was gently removed with a  rongeur after isolation of the radial sensory branches.   The wound is then inspected for bleeding points followed by repair with  intradermal 0 Prolene and a Steri-Strip.  Lidocaine 2% was infiltrated for  postoperative analgesia.   Attention was then directed to the left long finger.   A short incision was fashioned in the distal palmar crease directly over the  palpable mass.  The subcutaneous tissue was carefully divided revealing an 8  mm diameter ganglion on the ulnar aspect of the A1 pulley.  After blunt  retractors were placed protecting the neurovascular bundles, the ganglion  was removed with rongeurs.   The wound is irrigated and repaired with intradermal 3-0 Prolene.  Lidocaine 2% was infiltrated for  postoperative analgesia followed by application of a  compressive dressing with Ace wrap.  There were no apparent complications.      Katy Fitch Sypher, M.D.  Electronically Signed     RVS/MEDQ  D:  10/20/2005  T:  10/20/2005  Job:  [id number]   cc:   Ramon Dredge L. Juanetta Gosling, M.D.  Fax: [phone]   Kathryne Hitch, MD  Fax: [phone]

## 2010-09-30 NOTE — H&P (Signed)
[facility]  Patient:    Jasmin [name]                       MRN: [id number] Adm. Date:  [id number] Attending:  Koren [name]                         History and Physical  REASON FOR ADMISSION:  Chest pain.  PRIMARY CARE PHYSICIAN:  Dr. Tiburcio [name].  HISTORY OF PRESENT ILLNESS:  Jasmin [name] is a 65 year old female, who initially started experiencing chest pain at 6 p.m.  The pain persisted for approximately 3-5 minutes, then resolved.  She had a second episode which also resolved.  On her third episode she called Dr. Tiburcio [name], who instructed her to present to the emergency room.  By the time she arrived to the ER the chest pain had resolved.  The chest pain was described as a chest burning sensation which radiated across her chest  associated with significant shortness of breath and nausea.  However, there was no diaphoresis.  No vomiting.  This was somewhat unlike her previous chest pain.  She has a past medical history of coronary artery disease, status post coronary  angioplasty six years ago by both Drs. Jasmin [name] and Jasmin [name].  She saw Dr. [place] in the cardiology clinic approximately five years ago.  She has been noncompliant ith her aspirin secondary to upper GI distress.  PAST MEDICAL HISTORY: 1. Coronary artery disease as noted above. 2. Hypothyroidism. 3. Fibromyalgia. 4. Posttraumatic syndrome. 5. Degenerative joint disease. 6. Rheumatoid arthritis.  CURRENT MEDICATIONS:  Synthroid, vitamins, Tagamet p.r.n., Lorcet p.r.n.  ALLERGIES:  She has no known drug allergies.  PAST SURGICAL HISTORY:  Significant for: 1. Cholecystectomy. 2. Hysterectomy. 3. Spinal fusion. 4. Hip surgery. 5. Tubal ligation. 6. Appendectomy. 7. TMJ repair.  SOCIAL HISTORY:  She is married, lives with her husband and is under significant stress related to her posttraumatic stress disorder.  She continues to smoke one-pack per day.  No  alcohol use.  PHYSICAL EXAMINATION:  GENERAL:  Physical examination reveals a middle-aged female in no acute distress.  VITAL SIGNS:  Blood pressure is 132/67.  Heart rate is 87.  She is afebrile.  HEENT:  Unremarkable.  Neck vein distension noted.  PULMONARY:  Examination reveals breath sounds which are equal and clear to auscultation.  CARDIOVASCULAR:  Examination reveals a normal S1, normal S2, regular rate and rhythm.  No rubs, murmurs, or gallops are noted.  ABDOMEN:  Soft and nontender.  There is no abdominal bruits noted.  EXTREMITIES:  Do not reveal peripheral edema.  There is no pedal edema.  SKIN:  Warm and dry.  NEUROLOGICAL:  She is alert.  Jasmin [name] is appropriate.  No focal deficits noted.  LABORATORY DATA:  ABG is 7.37, pCO2 is 45, pO2 is 26, hematocrit is 37.  Her INR was 0.9.  White count was 5.2, hematocrit is 37.8, platelets is 248,000.  CK was 60, MB 0.9, troponin is less than 0.3.  Her ECG reveals a sinus tachycardia with normal R wave progression.  There is no ischemic changes noted on her ECG.  IMPRESSION: 1. Chest pain, not unlike her previous episode but not similar to her previous    episode.  She is status post angioplasty for more than six years, currently s    pain-free.  Therefore, we will obtain a rule out for myocardial infarction. A  stress Cardiolite will be performed if serial enzymes are negative.  Restart her    aspirin, continue with heparin per cardiology protocol. 2. Hypothyroidism.  She will be continued on her home Synthroid replacement. DD:  04/27/99 TD:  04/27/99 Job: [id number]

## 2010-09-30 NOTE — Cardiovascular Report (Signed)
[name], [name] NO.:  [id number]   MEDICAL RECORD NO.:  [id number]          PATIENT TYPE:  OIB   LOCATION:  1963                         FACILITY:  [facility]   PHYSICIAN:  Vesta Mixer, M.D. DATE OF BIRTH:  Mar 06, 1946   DATE OF PROCEDURE:  12/22/2005  DATE OF DISCHARGE:                              CARDIAC CATHETERIZATION   HISTORY OF PRESENT ILLNESS:  Jasmin [name] is a 65 year old female with a  history of cigarette smoking.  She has a history of coronary artery disease  and is status post angioplasty of her first diagonal artery many years ago.  She presented with symptoms consistent with unstable angina relieved with  nitroglycerin.  She is referred for heart catheterization for further  evaluation.   PROCEDURE:  Left heart catheterization with coronary angiography.   The right femoral artery was easily cannulated using a modified Seldinger  technique.   HEMODYNAMICS:  LV pressure was 153/11 with an aortic pressure of 153/70.   Angiography, left main.  The left main is fairly large and is otherwise  normal.   The left anterior descending artery has minor luminal irregularities.  There  is a 20-30% stenosis at the takeoff of the first diagonal artery.  The  remainder of the LAD has minor luminal irregularities.  The first diagonal  artery is a small to moderate-sized vessel.  The angioplasty site from 1994  looks great.  She has no significant irregularities.   The left circumflex artery is a moderate-sized vessel.  The first OM has  minor luminal irregularities.  The right coronary artery is small but is  dominant.  The proximal mid and distal vessel have no significant  irregularities.  The posterior descending artery has a 30% stenosis  proximally, but it does not appear to be flow limiting.  The posterior  lateral branch is a tiny branch.  There is a proximally 40-50% stenosis but  this also does not appear to be flow limiting.  The vessel is  quite small in  this area.   The left ventriculogram was performed in the 30 RAO position.  It revealed  normal/supranormal left ventricular systolic function.  The ejection  fraction was approximately 70-75%.  There was no mitral regurgitation.   COMPLICATIONS:  None.   CONCLUSIONS:  Minor coronary artery irregularities.  She has a good long  term success of angioplasty to her the first diagonal artery.  We will  continue with medical therapy.           ______________________________  Vesta Mixer, M.D.    PJN/MEDQ  D:  12/22/2005  T:  12/22/2005  Job:  [id number]   cc:   Ramon Dredge L. Juanetta Gosling, M.D.

## 2010-09-30 NOTE — H&P (Signed)
NAMEKENZLEY, KE NO.:  [id number]   MEDICAL RECORD NO.:  [id number]           PATIENT TYPE:   LOCATION:                               FACILITY:  [facility]   PHYSICIAN:  Vesta Mixer, M.D. DATE OF BIRTH:  Feb 24, 1946   DATE OF ADMISSION:  12/20/2005  DATE OF DISCHARGE:                                HISTORY & PHYSICAL   Jasmin [name] is a middle aged female with a history of coronary artery  disease.  She is status post PTCA of her first diagonal artery many years  ago.  She also has a history of hyperlipidemia and hypertension.  She is  admitted with symptoms consistent with angina.   The patient has a long history of coronary artery disease.  She is status  post angioplasty of her first diagonal artery 13 years ago.  She has  continued to smoke about a pack of cigarettes a day.  She has been under  lots of stress with her husband who was also recently found to have  significant three vessel coronary artery disease.  She recently has been  having lots of angina like symptoms.  She has deep chest pain and pressure.  She has not taken any nitroglycerin because her nitroglycerin are so old.  She also has had lots of headaches and chest pressure.  She was recently  found to have significant hypertension and we started her on Toprol and  Micardis.  She thinks that she that she has not really felt better since we  added those medications.   She denies any syncope or presyncope.  She denies any PND or orthopnea.   CURRENT MEDICATIONS:  Ecotrin 81 mg a day, Toprol XL 50 mg a day, Micardis  40 mg a day.   ALLERGIES:  None.   PAST MEDICAL HISTORY:  1.  Coronary artery disease - status post PTCA of her first diagonal artery      in 1994.  2.  Hyperlipidemia.  3.  Hypertension.   SOCIAL HISTORY:  The patient smokes a pack of cigarettes a day.   FAMILY HISTORY:  Unremarkable.   REVIEW OF SYSTEMS:  As per HPI.   PHYSICAL EXAMINATION:  GENERAL:  She is a  middle aged female in no acute distress.  She is alert  and oriented x3.  Her mood and affect are normal.  VITAL SIGNS:  Her weight is 163, blood pressure is 140/70 with a heart rate  of 68.  HEENT: Exam reveals 2+ carotids, she has no bruits, no JVD, no thyromegaly.  LUNGS: Clear to auscultation.  HEART: Regular rate S1-S2.  She has no murmurs.  ABDOMEN:  Exam reveals good bowel sounds and is nontender.  EXTREMITIES:  She has no clubbing, cyanosis or edema.  NEUROLOGICAL:  Exam is nonfocal.   Jasmin [name] presents with episodes of chest discomfort that sound a lot like  angina.  She really does not think that she could complete a stress test  and, in fact, it sounds like a stress test probably would be positive.  She  is having angina such that  I would not even trust it if a stress test were  to be negative.  We will go ahead and admit her for a diagnostic cath.  We  have discussed the risks, benefits and options of heart catheterization.  She understands and agrees to proceed.  We will schedule her for an  outpatient cath in several days.           ______________________________  Vesta Mixer, M.D.     PJN/MEDQ  D:  12/20/2005  T:  12/20/2005  Job:  [id number]   cc:   Ramon Dredge L. Juanetta Gosling, M.D.

## 2011-02-15 LAB — CBC
HCT: 36.1
HCT: 39.3
MCHC: 34.2
MCV: 91.6
MCV: 91.8
Platelets: 205
Platelets: 224
RBC: 3.94
RBC: 4.28
WBC: 6.9
WBC: 7.4

## 2011-02-15 LAB — BASIC METABOLIC PANEL
BUN: 10
BUN: 11
CO2: 26
Chloride: 100
Chloride: 101
GFR calc Af Amer: >60
GFR calc non Af Amer: >60
Potassium: 3.8
Potassium: 3.9
Sodium: 136

## 2011-02-15 LAB — PROTIME-INR
INR: 0.8
Prothrombin Time: 11.3 — ABNORMAL LOW

## 2011-04-20 ENCOUNTER — Other Ambulatory Visit: Payer: Self-pay | Admitting: Cardiovascular Disease

## 2011-08-18 ENCOUNTER — Emergency Department: Payer: Self-pay | Admitting: *Deleted

## 2011-09-28 ENCOUNTER — Encounter: Payer: Self-pay | Admitting: *Deleted

## 2011-10-11 ENCOUNTER — Telehealth: Payer: Self-pay | Admitting: Cardiovascular Disease

## 2011-10-11 NOTE — Telephone Encounter (Signed)
Pt has received some information about effient having more side effects and she wanted to know if she needed to stay on it or will they put her on something else

## 2011-10-11 NOTE — Telephone Encounter (Signed)
Pt wants to know if she can come off effient, been on over three years since stent placement. She sees her PCP regularly, has not been seen by cardiology since 04/20/2010. Her PCP declined stopping or changing effient, prefers cardiology advise. Pt states HX; stent restenosis, HTN, hyperlipidemia/ not on meds-intolerance, pt dont exercise, c/o indigestion but no problems bleeding other than hemorrhoids. Please advise if she can stop.

## 2011-10-12 NOTE — Telephone Encounter (Signed)
Dr Elease Hashimoto reviewed chart, pt is at high risk for restenosis. It is not advised to be of effient of a anti platelett type drug. Pt aggreed to plan and will continue med.

## 2012-02-28 ENCOUNTER — Encounter: Payer: Self-pay | Admitting: Cardiovascular Disease

## 2012-05-22 ENCOUNTER — Encounter: Payer: Self-pay | Admitting: Cardiovascular Disease

## 2015-05-18 DIAGNOSIS — E039 Hypothyroidism, unspecified: Secondary | ICD-10-CM | POA: Diagnosis not present

## 2015-05-18 DIAGNOSIS — E559 Vitamin D deficiency, unspecified: Secondary | ICD-10-CM | POA: Diagnosis not present

## 2015-05-18 DIAGNOSIS — M069 Rheumatoid arthritis, unspecified: Secondary | ICD-10-CM | POA: Diagnosis not present

## 2015-05-28 DIAGNOSIS — E039 Hypothyroidism, unspecified: Secondary | ICD-10-CM | POA: Diagnosis not present

## 2015-05-28 DIAGNOSIS — E559 Vitamin D deficiency, unspecified: Secondary | ICD-10-CM | POA: Diagnosis not present

## 2015-06-29 DIAGNOSIS — E559 Vitamin D deficiency, unspecified: Secondary | ICD-10-CM | POA: Diagnosis not present

## 2015-07-27 DIAGNOSIS — E559 Vitamin D deficiency, unspecified: Secondary | ICD-10-CM | POA: Diagnosis not present

## 2015-09-03 DIAGNOSIS — E559 Vitamin D deficiency, unspecified: Secondary | ICD-10-CM | POA: Diagnosis not present

## 2015-10-12 DIAGNOSIS — Z79891 Long term (current) use of opiate analgesic: Secondary | ICD-10-CM | POA: Diagnosis not present

## 2015-11-01 DIAGNOSIS — E559 Vitamin D deficiency, unspecified: Secondary | ICD-10-CM | POA: Diagnosis not present

## 2015-12-17 DIAGNOSIS — M069 Rheumatoid arthritis, unspecified: Secondary | ICD-10-CM | POA: Diagnosis not present

## 2015-12-17 DIAGNOSIS — R739 Hyperglycemia, unspecified: Secondary | ICD-10-CM | POA: Diagnosis not present

## 2015-12-17 DIAGNOSIS — E039 Hypothyroidism, unspecified: Secondary | ICD-10-CM | POA: Diagnosis not present

## 2015-12-27 DIAGNOSIS — M797 Fibromyalgia: Secondary | ICD-10-CM | POA: Diagnosis not present

## 2015-12-27 DIAGNOSIS — J449 Chronic obstructive pulmonary disease, unspecified: Secondary | ICD-10-CM | POA: Diagnosis not present

## 2015-12-27 DIAGNOSIS — I10 Essential (primary) hypertension: Secondary | ICD-10-CM | POA: Diagnosis not present

## 2015-12-27 DIAGNOSIS — M545 Low back pain: Secondary | ICD-10-CM | POA: Diagnosis not present

## 2016-03-21 DIAGNOSIS — E559 Vitamin D deficiency, unspecified: Secondary | ICD-10-CM | POA: Diagnosis not present

## 2016-05-10 DIAGNOSIS — D8949 Other mast cell activation disorder: Secondary | ICD-10-CM | POA: Diagnosis not present

## 2016-05-12 DIAGNOSIS — D8949 Other mast cell activation disorder: Secondary | ICD-10-CM | POA: Diagnosis not present

## 2016-07-12 DIAGNOSIS — E785 Hyperlipidemia, unspecified: Secondary | ICD-10-CM | POA: Diagnosis not present

## 2016-07-12 DIAGNOSIS — D4701 Cutaneous mastocytosis: Secondary | ICD-10-CM | POA: Diagnosis not present

## 2016-07-12 DIAGNOSIS — J449 Chronic obstructive pulmonary disease, unspecified: Secondary | ICD-10-CM | POA: Diagnosis not present

## 2016-07-12 DIAGNOSIS — I10 Essential (primary) hypertension: Secondary | ICD-10-CM | POA: Diagnosis not present

## 2016-07-12 DIAGNOSIS — F17210 Nicotine dependence, cigarettes, uncomplicated: Secondary | ICD-10-CM | POA: Diagnosis not present

## 2016-07-12 DIAGNOSIS — E7212 Methylenetetrahydrofolate reductase deficiency: Secondary | ICD-10-CM | POA: Diagnosis not present

## 2016-07-12 DIAGNOSIS — L508 Other urticaria: Secondary | ICD-10-CM | POA: Diagnosis not present

## 2016-07-12 DIAGNOSIS — D8940 Mast cell activation, unspecified: Secondary | ICD-10-CM | POA: Diagnosis not present

## 2016-07-12 DIAGNOSIS — M797 Fibromyalgia: Secondary | ICD-10-CM | POA: Diagnosis not present

## 2016-07-12 DIAGNOSIS — I2510 Atherosclerotic heart disease of native coronary artery without angina pectoris: Secondary | ICD-10-CM | POA: Diagnosis not present

## 2017-03-06 DIAGNOSIS — R42 Dizziness and giddiness: Secondary | ICD-10-CM | POA: Diagnosis not present

## 2017-03-06 DIAGNOSIS — H2513 Age-related nuclear cataract, bilateral: Secondary | ICD-10-CM | POA: Diagnosis not present

## 2017-03-06 DIAGNOSIS — H5203 Hypermetropia, bilateral: Secondary | ICD-10-CM | POA: Diagnosis not present

## 2017-03-06 DIAGNOSIS — H524 Presbyopia: Secondary | ICD-10-CM | POA: Diagnosis not present

## 2017-03-06 DIAGNOSIS — R51 Headache: Secondary | ICD-10-CM | POA: Diagnosis not present

## 2017-03-06 DIAGNOSIS — H52221 Regular astigmatism, right eye: Secondary | ICD-10-CM | POA: Diagnosis not present

## 2017-03-06 DIAGNOSIS — H5330 Unspecified disorder of binocular vision: Secondary | ICD-10-CM | POA: Diagnosis not present

## 2017-03-27 DIAGNOSIS — Z23 Encounter for immunization: Secondary | ICD-10-CM | POA: Diagnosis not present

## 2017-03-27 DIAGNOSIS — M5405 Panniculitis affecting regions of neck and back, thoracolumbar region: Secondary | ICD-10-CM | POA: Diagnosis not present

## 2017-03-27 DIAGNOSIS — J449 Chronic obstructive pulmonary disease, unspecified: Secondary | ICD-10-CM | POA: Diagnosis not present

## 2017-03-27 DIAGNOSIS — Z79891 Long term (current) use of opiate analgesic: Secondary | ICD-10-CM | POA: Diagnosis not present

## 2017-03-27 DIAGNOSIS — I10 Essential (primary) hypertension: Secondary | ICD-10-CM | POA: Diagnosis not present

## 2017-03-27 DIAGNOSIS — N814 Uterovaginal prolapse, unspecified: Secondary | ICD-10-CM | POA: Diagnosis not present

## 2017-03-30 ENCOUNTER — Encounter: Payer: Self-pay | Admitting: Obstetrics & Gynecology

## 2017-03-30 DIAGNOSIS — N814 Uterovaginal prolapse, unspecified: Secondary | ICD-10-CM | POA: Diagnosis not present

## 2017-03-30 DIAGNOSIS — I10 Essential (primary) hypertension: Secondary | ICD-10-CM | POA: Diagnosis not present

## 2017-03-30 DIAGNOSIS — J449 Chronic obstructive pulmonary disease, unspecified: Secondary | ICD-10-CM | POA: Diagnosis not present

## 2017-03-30 DIAGNOSIS — M545 Low back pain: Secondary | ICD-10-CM | POA: Diagnosis not present

## 2017-04-26 ENCOUNTER — Encounter: Payer: Self-pay | Admitting: *Deleted

## 2017-05-16 ENCOUNTER — Encounter: Payer: Self-pay | Admitting: Obstetrics and Gynecology

## 2017-05-30 ENCOUNTER — Encounter: Payer: Self-pay | Admitting: Obstetrics and Gynecology

## 2017-07-05 ENCOUNTER — Ambulatory Visit: Payer: PPO | Admitting: Obstetrics and Gynecology

## 2017-07-05 ENCOUNTER — Encounter: Payer: Self-pay | Admitting: Obstetrics and Gynecology

## 2017-07-05 VITALS — BP 140/80 | HR 93 | Ht 61.0 in | Wt 154.8 lb

## 2017-07-05 DIAGNOSIS — G894 Chronic pain syndrome: Secondary | ICD-10-CM | POA: Diagnosis not present

## 2017-07-05 DIAGNOSIS — N8111 Cystocele, midline: Secondary | ICD-10-CM

## 2017-07-05 DIAGNOSIS — M549 Dorsalgia, unspecified: Secondary | ICD-10-CM

## 2017-07-05 DIAGNOSIS — R197 Diarrhea, unspecified: Secondary | ICD-10-CM

## 2017-07-05 NOTE — Progress Notes (Signed)
Patient ID: HARUYE LAINEZ, female   DOB: 1945-09-13, 72 y.o.   MRN: [id number]   [facility] Visit  @DATE @            Patient name: Jasmin [name] MRN [id number]  Date of birth: 27-Jun-1945  CC & HPI:  Jasmin [name] is a 72 y.o. female presenting today for vaginal prolapse. She was referred to our practice by her PCP, Sinda Du, MD. She states that it is very obvious. She states that she can feel it when she urinates. The patient states she has had three bladder surgeries. She has a history of chronic back pain. She also endorses diarrhea and adds that she takes imodium for relief. The patient denies pain, fever, chills or any other symptoms or complaints at this time.   ROS:  ROS +vaginal prolapse +chronic back pain +diarrhea -pain -fever -chills All systems are negative except as noted in the HPI and PMH.   Pertinent History Reviewed:   Reviewed: Significant for Hysterectomy Medical         Past Medical History:  Diagnosis Date  . CAD (coronary artery disease)   . COPD (chronic obstructive pulmonary disease) ([place])   . Dyslipidemia   . GERD (gastroesophageal reflux disease)   . HTN (hypertension)   . Palpitations                               Surgical Hx:    Past Surgical History:  Procedure Laterality Date  . PTCA     Medications: Reviewed & Updated - see associated section                       Current Outpatient Medications:  .  lisinopril (PRINIVIL,ZESTRIL) 20 MG tablet, TAKE 1 TABLET BY MOUTH EVERY DAY, Disp: 30 tablet, Rfl: 0 .  metoprolol succinate (TOPROL-XL) 50 MG 24 hr tablet, Take 50 mg by mouth daily. Take with or immediately following a meal., Disp: , Rfl:  .  aspirin 325 MG tablet, Take 325 mg by mouth daily., Disp: , Rfl:  .  nitroGLYCERIN (NITROSTAT) 0.4 MG SL tablet, Place 0.4 mg under the tongue every 5 (five) minutes as needed., Disp: , Rfl:  .  prasugrel (EFFIENT) 10 MG TABS, Take 1 tablet (10 mg total) by mouth daily. (Patient not  taking: Reported on 07/05/2017), Disp: 90 tablet, Rfl: 3   Social History: Reviewed -  reports that she has been smoking e-cigarettes.  she has never used smokeless tobacco.  Objective Findings:  Vitals: Blood pressure 140/80, pulse 93, height 5\' 1"  (1.549 m), weight 154 lb 12.8 oz (70.2 kg).  PHYSICAL EXAMINATION General appearance - alert, well appearing, and in no distress and oriented to person, place, and time Mental status - alert, oriented to person, place, and time, normal mood, behavior, speech, dress, motor activity, and thought processes, affect appropriate to mood  PELVIC External genitalia - normal Vagina - reasonable length, good posterior and anterior support, 2 cm small bulge from the top of vagina that has a band of tissue around its edge Cervix - surgically absent Uterus - surgically absent   Assessment & Plan:   A:  1. Small recurrent cystocele  P:  1. Premarin vaginal Cream 2. Offer repeat Anterior repair after review of old records 3. Follow-up appointment 2 weeks for preop visit   By signing my name below, I, Margit Banda, attest  that this documentation has been prepared under the direction and in the presence of Jonnie Kind, MD. Electronically Signed: Margit Banda, Medical Scribe. 07/05/17. 2:47 PM.  I personally performed the services described in this documentation, which was SCRIBED in my presence. The recorded information has been reviewed and considered accurate. It has been edited as necessary during review. Jonnie Kind, MD

## 2017-07-05 NOTE — Progress Notes (Signed)
Review records of Dr Doran Stabler surgery with fascia, 20 yr ago. And Dr Rebekah [place] urology work before that. Please see the other note from this visit

## 2017-07-05 NOTE — Patient Instructions (Signed)
Cystocele Repair Cystocele repair is surgery to fix a cystocele, which is a bulging, drooping area (hernia) of the bladder that extends into the vagina. This bulging occurs on the top front (anterior) wall of the vagina. The condition may also be called an anterior prolapse or a prolapsed bladder. Tell a health care provider about:  Any allergies you have.  All medicines you are taking, including vitamins, herbs, eye drops, creams, and over-the-counter medicines.  Any problems you or family members have had with anesthetic medicines.  Any blood disorders you have.  Any surgeries you have had.  Any medical conditions you have.  Whether you are pregnant or may be pregnant. What are the risks? Generally, this is a safe procedure. However, problems may occur, including:  Infection.  Too much bleeding.  Allergic reactions to medicines.  Damage to other structures or organs.  Problems with the urinary drainage tube (catheter), such as blockage.  Return of the cystocele.  Problems with the vaginal mesh.  What happens before the procedure? Staying hydrated Follow instructions from your health care provider about hydration, which may include:  Up to 2 hours before the procedure - you may continue to drink clear liquids, such as water, clear fruit juice, black coffee, and plain tea.  Eating and drinking restrictions Follow instructions from your health care provider about eating and drinking, which may include:  8 hours before the procedure - stop eating heavy meals or foods such as meat, fried foods, or fatty foods.  6 hours before the procedure - stop eating light meals or foods, such as toast or cereal.  6 hours before the procedure - stop drinking milk or drinks that contain milk.  2 hours before the procedure - stop drinking clear liquids.  Medicines  Ask your health care provider about: ? Changing or stopping your regular medicines. This is especially important if  you are taking diabetes medicines or blood thinners. ? Taking medicines such as aspirin and ibuprofen. These medicines can thin your blood. Do not take these medicines before your procedure if your health care provider instructs you not to.  You may be given antibiotic medicine to help prevent infection. General instructions  Do not use any products that contain nicotine or tobacco-such as cigarettes and e-cigarettes-for at least 2 weeks before the procedure. If you need help quitting, ask your health care provider.  Do not drink any alcohol for 3 days before the surgery.  Plan to have someone take you home from the hospital or clinic.  Ask your health care provider how your surgical site will be marked or identified. What happens during the procedure?  To reduce your risk of infection: ? Your health care team will wash or sanitize their hands. ? Your skin will be washed with soap.  You will be given one or more of the following: ? A medicine to make you fall asleep (general anesthetic). ? A medicine that is injected into your spine to numb the area below and slightly above the injection site (spinal anesthetic). ? A medicine that is injected into an area of your body to numb everything below the injection site (regional anesthetic).  A thin, flexible tube (indwelling urinary catheter) will be placed in your bladder to drain urine during and after the surgery.  The surgery will be performed through the vagina. An incision will be made in the front wall of the vagina.  The muscle between the bladder and vagina will be pulled up to its normal  position. Stitches (sutures) or a piece of mesh will be used to support the muscle and hold it in place. This will remove the hernia so the top of the bladder does not fall into the opening of the vagina.  The incision on the front wall of the vagina will then be closed with stitches that will dissolve safely into your body and do not need to be  removed. The procedure may vary among health care providers and hospitals. What happens after the procedure?  Your blood pressure, heart rate, breathing rate, and blood oxygen level will be monitored until the medicines you were given have worn off.  You will have a catheter in place to drain your bladder. This will stay in place for 2-7 days or until your bladder is working well on its own.  You may have gauze packing in the vagina. This will be removed 1-2 days after the surgery.  You will be given pain medicine as needed.  You may be given antibiotics to fight infection. This information is not intended to replace advice given to you by your health care provider. Make sure you discuss any questions you have with your health care provider. Document Released: 04/28/2000 Document Revised: 12/03/2015 Document Reviewed: 07/22/2015 Elsevier Interactive Patient Education  2018 [employer].

## 2017-07-16 ENCOUNTER — Encounter: Payer: Self-pay | Admitting: Obstetrics and Gynecology

## 2017-07-16 ENCOUNTER — Ambulatory Visit: Payer: PPO | Admitting: Obstetrics and Gynecology

## 2017-07-16 VITALS — BP 140/70 | HR 74 | Ht 61.0 in | Wt 156.2 lb

## 2017-07-16 DIAGNOSIS — N8111 Cystocele, midline: Secondary | ICD-10-CM | POA: Diagnosis not present

## 2017-07-16 NOTE — Progress Notes (Signed)
Patient ID: Jasmin [name], female   DOB: Jan 24, 1946, 72 y.o.   MRN: [id number]  Preoperative History and Physical  Jasmin [name] is a 72 y.o. G4P0 here for surgical management of midline cystocele. She was originally seen by her PCP, Sinda Du, MD for bladder prolapse. She denies any trouble with bowel movements. No significant preoperative concerns. Her old chart has been reviewed and there is absolutely no evidence of her previous surgery records Proposed surgery: Anterior repair  Past Medical History:  Diagnosis Date  . CAD (coronary artery disease)   . COPD (chronic obstructive pulmonary disease) ([place])   . Dyslipidemia   . GERD (gastroesophageal reflux disease)   . HTN (hypertension)   . Palpitations    OB History  Gravida Para Term Preterm AB Living  4         3  SAB TAB Ectopic Multiple Live Births               # Outcome Date GA Lbr Len/2nd Weight Sex Delivery Anes PTL Lv  4 Gravida           3 Gravida           2 Gravida           1 Gravida             Patient denies any other pertinent gynecologic issues.   Current Outpatient Medications on File Prior to Visit  Medication Sig Dispense Refill  . liothyronine (CYTOMEL) 5 MCG tablet TAKE 1 TABLET BY MOUTH DAILY ON AN EMPTY STOMACH FOR 1 WEEK, THEN INCREASE TO 2 TABLETS DAILY  3  . lisinopril (PRINIVIL,ZESTRIL) 20 MG tablet TAKE 1 TABLET BY MOUTH EVERY DAY 30 tablet 0  . metoprolol succinate (TOPROL-XL) 50 MG 24 hr tablet Take 50 mg by mouth daily. Take with or immediately following a meal.    . nitroGLYCERIN (NITROSTAT) 0.4 MG SL tablet Place 0.4 mg under the tongue every 5 (five) minutes as needed.    . prasugrel (EFFIENT) 10 MG TABS Take 1 tablet (10 mg total) by mouth daily. 90 tablet 3  . aspirin 325 MG tablet Take 325 mg by mouth daily.     No current facility-administered medications on file prior to visit.    Allergies  Allergen Reactions  . Crestor [Rosuvastatin]     Social History:   reports that  she has been smoking e-cigarettes.  she has never used smokeless tobacco. She reports that she does not drink alcohol or use drugs.  Family History  Problem Relation Age of Onset  . Coronary artery disease Father   . Coronary artery disease Mother   . Heart attack Brother   . Coronary artery disease Sister     Review of Systems: Noncontributory  PHYSICAL EXAM: Blood pressure 140/70, pulse 74, height 5\' 1"  (1.549 m), weight 156 lb 3.2 oz (70.9 kg).   PHYSICAL EXAMINATION General appearance - alert, well appearing, and in no distress, oriented to person, place, and time and overweight Mental status - alert, oriented to person, place, and time, normal mood, behavior, speech, dress, motor activity, and thought processes, affect appropriate to mood Chest - clear to auscultation, no wheezes, rales or rhonchi, symmetric air entry Heart - normal rate and regular rhythm  PELVIC External genitalia -atrophic postmenopausal tissues with slight posterior support Vulva -normal for age Vagina - good support, 2 cm small bulge from the top of vagina that has a band of tissue around its edge.  The anterior wall bulges down almost to the Valsalva maneuver.  Patient reports that at times after a long day that she can actually feel it at the entrance of the vagina Cervix - surgically absent Uterus - surgically absent Adnexa -negative  Rectal -adequate posterior support in the lower half of the vagina.  We discussed whether the upper half should be repaired or not and have decided against it as a collaborative decision   Labs: No results found for this or any previous visit (from the past 336 hour(s)).  Imaging Studies: No results found.  Assessment: 1. Cystocele and possibly enterocele 2. No need for posterior repair at this time There are no active problems to display for this patient.   Plan: 1. Patient will undergo surgical management with Anterior Repair.  2. We will coordinate  scheduling notify patient of timing of schedule  By signing my name below, I, Margit Banda, attest that this documentation has been prepared under the direction and in the presence of Jonnie Kind, MD. Electronically Signed: Margit Banda, Medical Scribe. 07/16/17. 2:42 PM.  I personally performed the services described in this documentation, which was SCRIBED in my presence. The recorded information has been reviewed and considered accurate. It has been edited as necessary during review. Jonnie Kind, MD

## 2017-07-18 ENCOUNTER — Other Ambulatory Visit: Payer: Self-pay | Admitting: *Deleted

## 2017-07-18 MED ORDER — ESTROGENS, CONJUGATED 0.625 MG/GM VA CREA: 1.0000 g | TOPICAL_CREAM | Freq: Every day | VAGINAL | 12 refills | Status: DC

## 2017-07-24 ENCOUNTER — Telehealth: Payer: Self-pay | Admitting: *Deleted

## 2017-07-24 NOTE — Telephone Encounter (Signed)
Pt wants to cancel having surgery for now and will call back when ready to schedule it.  She says that she doesn't have peace of mind about it.  Will notify [facility].  07-24-17  AS

## 2017-08-01 ENCOUNTER — Other Ambulatory Visit (HOSPITAL_COMMUNITY): Payer: Self-pay

## 2017-08-02 DIAGNOSIS — M5136 Other intervertebral disc degeneration, lumbar region: Secondary | ICD-10-CM | POA: Diagnosis not present

## 2017-08-02 DIAGNOSIS — N814 Uterovaginal prolapse, unspecified: Secondary | ICD-10-CM | POA: Diagnosis not present

## 2017-08-02 DIAGNOSIS — J449 Chronic obstructive pulmonary disease, unspecified: Secondary | ICD-10-CM | POA: Diagnosis not present

## 2017-08-02 DIAGNOSIS — I2510 Atherosclerotic heart disease of native coronary artery without angina pectoris: Secondary | ICD-10-CM | POA: Diagnosis not present

## 2017-08-06 DIAGNOSIS — M5136 Other intervertebral disc degeneration, lumbar region: Secondary | ICD-10-CM | POA: Diagnosis not present

## 2017-08-06 DIAGNOSIS — I25709 Atherosclerosis of coronary artery bypass graft(s), unspecified, with unspecified angina pectoris: Secondary | ICD-10-CM | POA: Diagnosis not present

## 2017-08-06 DIAGNOSIS — J449 Chronic obstructive pulmonary disease, unspecified: Secondary | ICD-10-CM | POA: Diagnosis not present

## 2017-08-06 DIAGNOSIS — N814 Uterovaginal prolapse, unspecified: Secondary | ICD-10-CM | POA: Diagnosis not present

## 2017-08-07 ENCOUNTER — Ambulatory Visit: Admit: 2017-08-07 | Payer: Self-pay | Admitting: Obstetrics and Gynecology

## 2017-08-07 SURGERY — COLPORRHAPHY, ANTERIOR, FOR CYSTOCELE REPAIR
Anesthesia: General

## 2017-11-12 DIAGNOSIS — Z79891 Long term (current) use of opiate analgesic: Secondary | ICD-10-CM | POA: Diagnosis not present

## 2018-03-25 DIAGNOSIS — Z0000 Encounter for general adult medical examination without abnormal findings: Secondary | ICD-10-CM | POA: Diagnosis not present

## 2018-03-25 DIAGNOSIS — Z23 Encounter for immunization: Secondary | ICD-10-CM | POA: Diagnosis not present

## 2018-03-27 DIAGNOSIS — Z79891 Long term (current) use of opiate analgesic: Secondary | ICD-10-CM | POA: Diagnosis not present

## 2018-04-01 DIAGNOSIS — G2581 Restless legs syndrome: Secondary | ICD-10-CM | POA: Diagnosis not present

## 2018-04-01 DIAGNOSIS — N814 Uterovaginal prolapse, unspecified: Secondary | ICD-10-CM | POA: Diagnosis not present

## 2018-04-01 DIAGNOSIS — I10 Essential (primary) hypertension: Secondary | ICD-10-CM | POA: Diagnosis not present

## 2018-04-01 DIAGNOSIS — E7800 Pure hypercholesterolemia, unspecified: Secondary | ICD-10-CM | POA: Diagnosis not present

## 2018-04-01 DIAGNOSIS — M5136 Other intervertebral disc degeneration, lumbar region: Secondary | ICD-10-CM | POA: Diagnosis not present

## 2018-04-01 DIAGNOSIS — J449 Chronic obstructive pulmonary disease, unspecified: Secondary | ICD-10-CM | POA: Diagnosis not present

## 2018-04-01 DIAGNOSIS — M797 Fibromyalgia: Secondary | ICD-10-CM | POA: Diagnosis not present

## 2018-04-01 DIAGNOSIS — Z0000 Encounter for general adult medical examination without abnormal findings: Secondary | ICD-10-CM | POA: Diagnosis not present

## 2018-04-01 DIAGNOSIS — I2510 Atherosclerotic heart disease of native coronary artery without angina pectoris: Secondary | ICD-10-CM | POA: Diagnosis not present

## 2018-04-01 DIAGNOSIS — R002 Palpitations: Secondary | ICD-10-CM | POA: Diagnosis not present

## 2018-04-01 DIAGNOSIS — K580 Irritable bowel syndrome with diarrhea: Secondary | ICD-10-CM | POA: Diagnosis not present

## 2018-12-12 ENCOUNTER — Other Ambulatory Visit: Payer: Self-pay

## 2019-03-12 DIAGNOSIS — Z23 Encounter for immunization: Secondary | ICD-10-CM | POA: Diagnosis not present

## 2019-03-12 DIAGNOSIS — I10 Essential (primary) hypertension: Secondary | ICD-10-CM | POA: Diagnosis not present

## 2019-03-12 DIAGNOSIS — I2510 Atherosclerotic heart disease of native coronary artery without angina pectoris: Secondary | ICD-10-CM | POA: Diagnosis not present

## 2019-03-12 DIAGNOSIS — M5136 Other intervertebral disc degeneration, lumbar region: Secondary | ICD-10-CM | POA: Diagnosis not present

## 2019-03-12 DIAGNOSIS — J449 Chronic obstructive pulmonary disease, unspecified: Secondary | ICD-10-CM | POA: Diagnosis not present

## 2019-03-19 DIAGNOSIS — Z79891 Long term (current) use of opiate analgesic: Secondary | ICD-10-CM | POA: Diagnosis not present

## 2019-04-21 DIAGNOSIS — M129 Arthropathy, unspecified: Secondary | ICD-10-CM | POA: Diagnosis not present

## 2019-04-21 DIAGNOSIS — E559 Vitamin D deficiency, unspecified: Secondary | ICD-10-CM | POA: Diagnosis not present

## 2019-04-21 DIAGNOSIS — M545 Low back pain: Secondary | ICD-10-CM | POA: Diagnosis not present

## 2019-04-21 DIAGNOSIS — Z79899 Other long term (current) drug therapy: Secondary | ICD-10-CM | POA: Diagnosis not present

## 2019-04-21 DIAGNOSIS — M542 Cervicalgia: Secondary | ICD-10-CM | POA: Diagnosis not present

## 2019-04-21 DIAGNOSIS — G8929 Other chronic pain: Secondary | ICD-10-CM | POA: Diagnosis not present

## 2019-05-05 DIAGNOSIS — M545 Low back pain: Secondary | ICD-10-CM | POA: Diagnosis not present

## 2019-05-05 DIAGNOSIS — Z79899 Other long term (current) drug therapy: Secondary | ICD-10-CM | POA: Diagnosis not present

## 2019-05-05 DIAGNOSIS — G894 Chronic pain syndrome: Secondary | ICD-10-CM | POA: Diagnosis not present

## 2019-05-05 DIAGNOSIS — G8929 Other chronic pain: Secondary | ICD-10-CM | POA: Diagnosis not present

## 2019-07-01 ENCOUNTER — Other Ambulatory Visit: Payer: Self-pay

## 2019-07-01 ENCOUNTER — Encounter: Payer: PPO | Attending: Physical Medicine and Rehabilitation | Admitting: Physical Medicine and Rehabilitation

## 2019-07-01 ENCOUNTER — Encounter: Payer: Self-pay | Admitting: Physical Medicine and Rehabilitation

## 2019-07-01 VITALS — BP 156/74 | HR 83 | Temp 97.7°F | Ht 61.0 in | Wt 155.0 lb

## 2019-07-01 DIAGNOSIS — D8940 Mast cell activation, unspecified: Secondary | ICD-10-CM | POA: Diagnosis not present

## 2019-07-01 DIAGNOSIS — M545 Low back pain, unspecified: Secondary | ICD-10-CM

## 2019-07-01 DIAGNOSIS — Z79891 Long term (current) use of opiate analgesic: Secondary | ICD-10-CM | POA: Insufficient documentation

## 2019-07-01 DIAGNOSIS — Z5181 Encounter for therapeutic drug level monitoring: Secondary | ICD-10-CM | POA: Diagnosis not present

## 2019-07-01 DIAGNOSIS — G894 Chronic pain syndrome: Secondary | ICD-10-CM | POA: Diagnosis not present

## 2019-07-01 DIAGNOSIS — M797 Fibromyalgia: Secondary | ICD-10-CM | POA: Diagnosis not present

## 2019-07-01 DIAGNOSIS — G8929 Other chronic pain: Secondary | ICD-10-CM | POA: Diagnosis not present

## 2019-07-01 MED ORDER — PREGABALIN 25 MG PO CAPS: 25.0000 mg | ORAL_CAPSULE | Freq: Two times a day (BID) | ORAL | 1 refills | Status: DC

## 2019-07-01 NOTE — Progress Notes (Addendum)
Subjective:    Patient ID: Jasmin [name], female    DOB: Aug 24, 1945, 74 y.o.   MRN: [id number]  HPI  History obtained from chart review and patient.  Jasmin [name] is a 74 year old woman with midline cystocele, fibromyalgia, uterovaginal prolapse, CAD, COPD, Dyslipidemia, GERD, palpitations, HTN, vitamin D deficiency, rheumatoid arthritis, who presents to establish care for chronic lumbosacral pain.  She has been prescribed Norco #112 once a month for 2 years. The Norco 10mg  provides her 4 hours of relief and she takes 4 pills per day.  Sources of pain: neck, low back, shoulders.  Quality of pain: aching, stabbing, throbbing Duration of pain: constant Aggravating factors: bending, twisting, standing, sitting Associated symptoms: numbness, tingling, weakness, stiffness, decreased range of motion  Her pain is debilitating and has caused her to become depressed because of all of her limitations. Her sources of joy are watching TV, going on her ipad, and spending time with her children, grandchildren, and great grandchildren.   She has never tried Lyrica before. She has had genetic testing done which shows that she does not absorb vitamins well.  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 She has seen an allergist in [place], [place] who prescribed her 4 allergy medications but it was too far for her to keep going there. That is also why she switched pain providers-she loved her formed pain provider, Jeanella Anton, but it was too far for her to go see her.    Pain Inventory Average Pain 8 Pain Right Now 6 My pain is sharp, burning, dull, stabbing, tingling and aching  In the last 24 hours, has pain interfered with the following? General activity 10 Relation with others 8 Enjoyment of life 10 What TIME of day is your pain at its worst? n/a Sleep (in general) Poor  Pain is worse with: walking, bending, sitting, inactivity, standing and  some activites Pain improves with: medication Relief from Meds: 6  Mobility walk with assistance how many minutes can you walk? 5 do you drive?  yes  Function retired I need assistance with the following:  meal prep and household duties  Neuro/Psych bladder control problems bowel control problems weakness numbness tingling trouble walking dizziness  Prior Studies new  Physicians involved in your care new   Family History  Problem Relation Age of Onset  . Coronary artery disease Father   . Coronary artery disease Mother   . Heart attack Brother   . Coronary artery disease Sister    Social History   Socioeconomic History  . Marital status: Married    Spouse name: Not on file  . Number of children: Not on file  . Years of education: Not on file  . Highest education level: Not on file  Occupational History  . Not on file  Tobacco Use  . Smoking status: Current Every Day Smoker    Types: E-cigarettes    Last attempt to quit: 01/13/2009    Years since quitting: 10.4  . Smokeless tobacco: Never Used  Substance and Sexual Activity  . Alcohol use: No  . Drug use: No  . Sexual activity: Not Currently  Other Topics Concern  . Not on file  Social History Narrative  . Not on file   Social Determinants of Health   Financial Resource Strain:   . Difficulty of Paying Living Expenses: Not on file  Food Insecurity:   . Worried About Charity fundraiser in the  Last Year: Not on file  . Ran Out of Food in the Last Year: Not on file  Transportation Needs:   . Lack of Transportation (Medical): Not on file  . Lack of Transportation (Non-Medical): Not on file  Physical Activity:   . Days of Exercise per Week: Not on file  . Minutes of Exercise per Session: Not on file  Stress:   . Feeling of Stress : Not on file  Social Connections:   . Frequency of Communication with Friends and Family: Not on file  . Frequency of Social Gatherings with Friends and Family: Not on  file  . Attends Religious Services: Not on file  . Active Member of Clubs or Organizations: Not on file  . Attends Archivist Meetings: Not on file  . Marital Status: Not on file   Past Surgical History:  Procedure Laterality Date  . PTCA     Past Medical History:  Diagnosis Date  . CAD (coronary artery disease)   . COPD (chronic obstructive pulmonary disease) ([place])   . Dyslipidemia   . GERD (gastroesophageal reflux disease)   . HTN (hypertension)   . Palpitations    BP (!) 156/74   Pulse 83   Temp 97.7 F (36.5 C)   Ht 5\' 1"  (1.549 m)   Wt 155 lb (70.3 kg)   SpO2 97%   BMI 29.29 kg/m   Opioid Risk Score:   Fall Risk Score:  `1  Depression screen PHQ 2/9  No flowsheet data found.  Review of Systems  Constitutional: Negative.   HENT: Negative.   Eyes: Negative.   Respiratory: Negative.   Cardiovascular: Negative.   Gastrointestinal: Negative.        Bowel control problem   Endocrine: Negative.   Genitourinary: Positive for difficulty urinating.  Musculoskeletal: Positive for back pain, gait problem, myalgias, neck pain and neck stiffness.  Skin: Negative.   Allergic/Immunologic: Negative.   Neurological: Positive for dizziness, weakness and numbness.       Tingling   Hematological: Negative.   Psychiatric/Behavioral: Negative.   All other systems reviewed and are negative.      Objective:   Physical Exam Gen: no distress, normal appearing HEENT: oral mucosa pink and moist, NCAT Cardio: Reg rate Chest: normal effort, normal rate of breathing Abd: soft, non-distended Ext: no edema Skin: intact Neuro: AOx3 Musculoskeletal: Diffuse weakness that is pain limited. Diffuse tender points in upper and lower body in both sides of the body. Prefers standing position to sitting position.  Psych: pleasant, normal affect     Assessment & Plan:  History obtained from chart review and patient.  [name] is a 74 year old woman with midline  cystocele, fibromyalgia, uterovaginal prolapse, CAD, COPD, Dyslipidemia, GERD, palpitations, HTN, vitamin D deficiency, rheumatoid arthritis, who presents to establish care for chronic lumbosacral pain.  1) Failed back syndrome: -Pain contract signed today. -Urine sample ordered. If negative, we can prescribed Norco on next visit. Discussed that our goal is to wean patient off Norco long term as it is not ideal for chronic pain and she can develop dependence and/or opioid induced hyperanalgesia.  -Prescribed Lyrica 25mg  BID for pain control. Advised her to call me after a few days if she does not have adverse side effects and we can consider uptitrating dose.   2) Mast cell cytosis -Provided referral to allergist.  -She has had course of steroids in the past that helped her.  -I think treatment of this syndrome could provide her  improved quality of life.   3) Depression -She defers follow up with psychologist or medication treatment at this time. -Dicussed her sources of joy and encouraged her to spend more time with her great grandchildren when possible.   RTC in 4 weeks to assess progress with above interventions.   3/2 Addendum: Urine sample within expectations; Norco refill sent

## 2019-07-02 ENCOUNTER — Telehealth: Payer: Self-pay

## 2019-07-02 ENCOUNTER — Other Ambulatory Visit: Payer: Self-pay | Admitting: Physical Medicine and Rehabilitation

## 2019-07-02 NOTE — Telephone Encounter (Signed)
I have sent the Lyrica for the patient. Yvone Neu it would be great if you can help with the patient assistance! I just tried to call patient back to tell her that we do not prescribe the opioid until after we receive the results of the urine sample, but her line was busy and there is only one phone number listed in her chart.

## 2019-07-02 NOTE — Telephone Encounter (Signed)
Patient called stating that a pain med was suppose to be called in for her yesterday and it was not. Also that the med that was called in the copay is $60 and she can't afford it. According to the note it was Pregabalin. If she is going to be on it Yvone Neu may can help her with the patient assistance program

## 2019-07-04 LAB — TOXASSURE SELECT 13 (MW), URINE

## 2019-07-06 MED ORDER — HYDROCODONE-ACETAMINOPHEN 10-325 MG PO TABS: 1.0000 | ORAL_TABLET | Freq: Four times a day (QID) | ORAL | 0 refills | Status: DC | PRN

## 2019-07-06 NOTE — Addendum Note (Signed)
Addended by: Izora Ribas on: 07/06/2019 07:17 PM   Modules accepted: Orders

## 2019-07-14 NOTE — Telephone Encounter (Signed)
Attempted to contact patient. Left a voicemail to contact our office if she is interested in applying for the [employer] assistance program.

## 2019-07-15 ENCOUNTER — Other Ambulatory Visit: Payer: Self-pay | Admitting: Physical Medicine and Rehabilitation

## 2019-07-15 MED ORDER — HYDROCODONE-ACETAMINOPHEN 10-325 MG PO TABS: 2.0000 | ORAL_TABLET | Freq: Four times a day (QID) | ORAL | 0 refills | Status: DC | PRN

## 2019-07-15 NOTE — Addendum Note (Signed)
Addended by: Izora Ribas on: 07/15/2019 12:57 PM   Modules accepted: Orders

## 2019-07-29 ENCOUNTER — Ambulatory Visit: Payer: PPO | Admitting: Pain Medicine

## 2019-07-29 ENCOUNTER — Encounter: Payer: PPO | Admitting: Physical Medicine and Rehabilitation

## 2019-08-01 ENCOUNTER — Encounter: Payer: PPO | Attending: Physical Medicine and Rehabilitation | Admitting: Physical Medicine and Rehabilitation

## 2019-08-01 ENCOUNTER — Other Ambulatory Visit: Payer: Self-pay

## 2019-08-01 ENCOUNTER — Encounter: Payer: Self-pay | Admitting: Physical Medicine and Rehabilitation

## 2019-08-01 VITALS — BP 138/72 | HR 79 | Temp 98.5°F | Ht 62.0 in | Wt 157.0 lb

## 2019-08-01 DIAGNOSIS — G8929 Other chronic pain: Secondary | ICD-10-CM | POA: Diagnosis not present

## 2019-08-01 DIAGNOSIS — M545 Low back pain, unspecified: Secondary | ICD-10-CM

## 2019-08-01 DIAGNOSIS — Z79891 Long term (current) use of opiate analgesic: Secondary | ICD-10-CM | POA: Diagnosis not present

## 2019-08-01 DIAGNOSIS — M797 Fibromyalgia: Secondary | ICD-10-CM | POA: Diagnosis not present

## 2019-08-01 DIAGNOSIS — D8940 Mast cell activation, unspecified: Secondary | ICD-10-CM | POA: Diagnosis not present

## 2019-08-01 DIAGNOSIS — Z5181 Encounter for therapeutic drug level monitoring: Secondary | ICD-10-CM | POA: Insufficient documentation

## 2019-08-01 DIAGNOSIS — G894 Chronic pain syndrome: Secondary | ICD-10-CM | POA: Diagnosis not present

## 2019-08-01 MED ORDER — CLOPIDOGREL BISULFATE 75 MG PO TABS: 75.0000 mg | ORAL_TABLET | Freq: Every day | ORAL | 3 refills | Status: DC

## 2019-08-01 NOTE — Progress Notes (Signed)
Subjective:    Patient ID: Jasmin [name], female    DOB: 07-08-1945, 74 y.o.   MRN: [id number]  HPI  History obtained from chart review and patient.  Jasmin [name] is a 74 year old woman with midline cystocele, fibromyalgia, uterovaginal prolapse, CAD, COPD, Dyslipidemia, GERD, palpitations, HTN, vitamin D deficiency, rheumatoid arthritis, who presents for follow-up of chronic lumbosacral pain.  She has been prescribed Norco #112 once a month for 2 years. The Norco 10mg  provides her 4 hours of relief and she takes 8 pills per day.   Sources of pain: neck, low back, shoulders.  Quality of pain: aching, stabbing, throbbing Duration of pain: constant Aggravating factors: bending, twisting, standing, sitting Associated symptoms: numbness, tingling, weakness, stiffness, decreased range of motion  Her pain is debilitating and has caused her to become depressed because of all of her limitations. Her sources of joy are watching TV, going on her ipad, and spending time with her children, grandchildren, and great grandchildren.   She has never tried Lyrica before. She has had genetic testing done which shows that she does not absorb vitamins well. She tried to get the Lyrica but it cost her $60 copay and she could not afford this. I offered assistance from our staff in obtaining [employer] assistance and she defers at this time as she is in too much pain and wants to get home.   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 She has seen an allergist in [place], [place] who prescribed her 4 allergy medications but it was too far for her to keep going there. That is also why she switched pain providers-she loved her formed pain provider, Jeanella Anton, but it was too far for her to go see her. Last visit I referred her to an allergist within [facility] but this was still too far for her to see.   Pain Inventory Average Pain 8 Pain Right Now 9 My pain is sharp,  burning, dull, stabbing, tingling and aching  In the last 24 hours, has pain interfered with the following? General activity 10 Relation with others 10 Enjoyment of life 10 What TIME of day is your pain at its worst? varies Sleep (in general) Poor  Pain is worse with: walking, bending, sitting, inactivity and standing Pain improves with: rest and therapy/exercise Relief from Meds: 6  Mobility how many minutes can you walk? 5 ability to climb steps?  yes do you drive?  yes  Function retired I need assistance with the following:  meal prep, household duties and shopping  Neuro/Psych weakness  Prior Studies Any changes since last visit?  no  Physicians involved in your care Any changes since last visit?  no   Family History  Problem Relation Age of Onset  . Coronary artery disease Father   . Coronary artery disease Mother   . Heart attack Brother   . Coronary artery disease Sister    Social History   Socioeconomic History  . Marital status: Married    Spouse name: Not on file  . Number of children: Not on file  . Years of education: Not on file  . Highest education level: Not on file  Occupational History  . Not on file  Tobacco Use  . Smoking status: Current Every Day Smoker    Types: E-cigarettes    Last attempt to quit: 01/13/2009    Years since quitting: 10.5  . Smokeless tobacco: Never Used  Substance and Sexual  Activity  . Alcohol use: No  . Drug use: No  . Sexual activity: Not Currently  Other Topics Concern  . Not on file  Social History Narrative  . Not on file   Social Determinants of Health   Financial Resource Strain:   . Difficulty of Paying Living Expenses:   Food Insecurity:   . Worried About Charity fundraiser in the Last Year:   . Arboriculturist in the Last Year:   Transportation Needs:   . Film/video editor (Medical):   Marland Kitchen Lack of Transportation (Non-Medical):   Physical Activity:   . Days of Exercise per Week:   . Minutes  of Exercise per Session:   Stress:   . Feeling of Stress :   Social Connections:   . Frequency of Communication with Friends and Family:   . Frequency of Social Gatherings with Friends and Family:   . Attends Religious Services:   . Active Member of Clubs or Organizations:   . Attends Archivist Meetings:   Marland Kitchen Marital Status:    Past Surgical History:  Procedure Laterality Date  . PTCA     Past Medical History:  Diagnosis Date  . CAD (coronary artery disease)   . COPD (chronic obstructive pulmonary disease) ([place])   . Dyslipidemia   . GERD (gastroesophageal reflux disease)   . HTN (hypertension)   . Palpitations    BP 138/72   Pulse 79   Temp 98.5 F (36.9 C)   Ht 5\' 2"  (1.575 m)   Wt 157 lb (71.2 kg)   SpO2 94%   BMI 28.72 kg/m   Opioid Risk Score:   Fall Risk Score:  `1  Depression screen PHQ 2/9  Depression screen PHQ 2/9 07/01/2019  Decreased Interest 3  Down, Depressed, Hopeless 2  PHQ - 2 Score 5  Altered sleeping 3  Tired, decreased energy 3  Change in appetite 2  Feeling bad or failure about yourself  3  Trouble concentrating 2  Moving slowly or fidgety/restless 0  Suicidal thoughts 1  PHQ-9 Score 19    Review of Systems  All other systems reviewed and are negative.      Objective:   Physical Exam Gen: no distress, normal appearing HEENT: oral mucosa pink and moist, NCAT Cardio: Reg rate Chest: normal effort, normal rate of breathing Abd: soft, non-distended Ext: no edema Skin: intact Neuro: AOx3 Musculoskeletal: Diffuse weakness that is pain limited. Diffuse tender points in upper and lower body in both sides of the body. Prefers standing position to sitting position. Unable to sit for long without intense pain. Antalgic gait.  Psych: pleasant, normal affect    Assessment & Plan:  History obtained from chart review and patient.  Jasmin [name] is a 74 year old woman with midline cystocele, fibromyalgia, uterovaginal prolapse, CAD,  COPD, Dyslipidemia, GERD, palpitations, HTN, vitamin D deficiency, rheumatoid arthritis, who presents to establish care for chronic lumbosacral pain.  1) Failed back syndrome: -Pain contract signed last visit -Urine sample reviewed since last visit and was negative. Hydrocodone #224 have since been prescribed for 1 month. Discussed that our goal is to wean patient off Norco long term as it is not ideal for chronic pain and she can develop dependence and/or opioid induced hyperanalgesia.  -Prescribed Lyrica 25mg  BID for pain control but she was unable to afford copay. Offered [employer] assistance application and she would like to pursue this option next visit as she is in too much pain right  now.   2) Mast cell cytosis -Provided referral to allergist but she states this is too far for her and will look for a closer allergist.  -She has had course of steroids in the past that helped her.  -I think treatment of this syndrome could provide her improved quality of life.   3) Depression -She defers follow up with psychologist or medication treatment at this time. -Dicussed her sources of joy and encouraged her to spend more time with her great grandchildren when possible.   4) Impaired mobility and ADLs -She is able to ambulate less than 200 feet. Renewed handicap placard.   RTC in 4 weeks to assess progress with above interventions.

## 2019-08-14 ENCOUNTER — Telehealth: Payer: Self-pay | Admitting: *Deleted

## 2019-08-14 NOTE — Telephone Encounter (Signed)
Jasmin [name] called and requested a refill on her hydrocodone.  Her last fill was 08/01/2019 #112.

## 2019-08-15 ENCOUNTER — Other Ambulatory Visit: Payer: Self-pay | Admitting: Physical Medicine and Rehabilitation

## 2019-08-15 MED ORDER — HYDROCODONE-ACETAMINOPHEN 10-325 MG PO TABS: 2.0000 | ORAL_TABLET | Freq: Four times a day (QID) | ORAL | 0 refills | Status: DC | PRN

## 2019-08-15 NOTE — Telephone Encounter (Signed)
I have sent; thank you!

## 2019-08-21 ENCOUNTER — Telehealth: Payer: Self-pay | Admitting: Family Medicine

## 2019-08-21 NOTE — Telephone Encounter (Signed)
Patient returned our call to do the covid screening for her new patient appointment with Debbie on 08/25/19.  Patient said she can't wear a mask because she has an allergic reaction and she has swelling of her nose and throat and she can't breath.  Patient said she's allergic to several things.  She holds a plastic shield up to her face because she's allergic to the bands on the shield.  She has a note from her doctor.  Patient said she had Covid last February. Is it okay for patient to hold shield up to her face?

## 2019-08-21 NOTE — Telephone Encounter (Signed)
I left a message on patient's voice mail return my call.

## 2019-08-21 NOTE — Telephone Encounter (Signed)
Please call patient and tell her that will be fine as long as she is able to hold the shield up at all times when there is someone else in the room. Is she able to wear a combination shield/ goggles that does not contain elastic?

## 2019-08-22 NOTE — Telephone Encounter (Signed)
I spoke with patient and let her know Debbie's comments.  Patient said she can't wear the shield/goggles either.

## 2019-08-22 NOTE — Telephone Encounter (Signed)
Please let her know it is fine as long as she is able to maintain the shield in front of her mouth and nose.

## 2019-08-25 ENCOUNTER — Other Ambulatory Visit: Payer: Self-pay

## 2019-08-25 ENCOUNTER — Ambulatory Visit (INDEPENDENT_AMBULATORY_CARE_PROVIDER_SITE_OTHER): Payer: PPO | Admitting: Family Medicine

## 2019-08-25 ENCOUNTER — Encounter: Payer: Self-pay | Admitting: Family Medicine

## 2019-08-25 VITALS — BP 112/84 | HR 74 | Temp 98.1°F | Ht 62.0 in | Wt 161.0 lb

## 2019-08-25 DIAGNOSIS — G894 Chronic pain syndrome: Secondary | ICD-10-CM

## 2019-08-25 DIAGNOSIS — G8929 Other chronic pain: Secondary | ICD-10-CM

## 2019-08-25 DIAGNOSIS — D8940 Mast cell activation, unspecified: Secondary | ICD-10-CM

## 2019-08-25 DIAGNOSIS — Z7689 Persons encountering health services in other specified circumstances: Secondary | ICD-10-CM | POA: Diagnosis not present

## 2019-08-25 DIAGNOSIS — Z720 Tobacco use: Secondary | ICD-10-CM | POA: Diagnosis not present

## 2019-08-25 DIAGNOSIS — M545 Low back pain, unspecified: Secondary | ICD-10-CM

## 2019-08-25 MED ORDER — DOXYCYCLINE HYCLATE 100 MG PO CAPS: ORAL_CAPSULE | ORAL | 0 refills | Status: DC

## 2019-08-25 NOTE — Patient Instructions (Signed)
Good to meet you today  Please follow up in 6 months for complete physical and lab work  Let me know if you need additional refills  We will wean off your doxycycline  You will get a call about seeing a new allergist in [place]

## 2019-08-25 NOTE — Progress Notes (Signed)
Subjective:    Patient ID: Jasmin [name], female    DOB: May 31, 1945, 74 y.o.   MRN: [id number]  HPI Chief Complaint  Patient presents with  . New Patient (Initial Visit)     This is a 74 yo female who presents today to establish care. Prior patient of Dr. Sinda Du (retired early 2021). She lives with her husband.  She has grown children in [place], [place] in [place].  Last CPE- last fall Mammo- does not want any more screening Colonoscopy- aged out of screening Tdap-declines Flu-annual Eye- two years ago Dental- not regular Exercise- unable due to pain, can not walk very far Tobacco use-she uses electronic cigarettes and is not interested in quitting at this time  Chronic pain- Sees Dr. Rudean Haskell at [place] and [facility].   Mast cell reactions- 10-15 years, needs new allergist. Prefers [place].    CAD- no chest pain, breathing "bad," uses e cigarettes.   Children have health care POA.   Saw Dr. Sharol Roussel in past for autoimmune disorders. Takes doxycycline 100 mg twice daily for 3 kinds of arthritis.  Was on fluconazole prior for "fungal arthritis".  She was getting low on her doxycycline so has been taking once a day instead of twice a day for three weeks. No difference in symptoms.      Review of Systems Per HPI    Objective:   Physical Exam Vitals reviewed.  Constitutional:      General: She is not in acute distress.    Appearance: Normal appearance. She is normal weight. She is not ill-appearing, toxic-appearing or diaphoretic.  HENT:     Head: Normocephalic and atraumatic.  Eyes:     Conjunctiva/sclera: Conjunctivae normal.  Cardiovascular:     Rate and Rhythm: Normal rate and regular rhythm.     Heart sounds: Normal heart sounds.  Pulmonary:     Effort: Pulmonary effort is normal.     Breath sounds: Normal breath sounds.  Musculoskeletal:     Cervical back: Normal range of motion and neck supple.  Neurological:     Mental  Status: She is alert and oriented to person, place, and time.  Psychiatric:        Mood and Affect: Mood normal.        Behavior: Behavior normal.        Thought Content: Thought content normal.        Judgment: Judgment normal.       BP 112/84 (BP Location: Left Arm, Patient Position: Sitting, Cuff Size: Normal)   Pulse 74   Temp 98.1 F (36.7 C) (Temporal)   Ht 5\' 2"  (1.575 m)   Wt 161 lb (73 kg)   SpO2 98%   BMI 29.45 kg/m  Wt Readings from Last 3 Encounters:  08/25/19 161 lb (73 kg)  08/01/19 157 lb (71.2 kg)  07/01/19 155 lb (70.3 kg)       Assessment & Plan:  1. Encounter to establish care -Viewed available records and EMR, will attempt to get records from prior provider -Follow-up in 6 months for complete physical exam.  Reviewed overdue health maintenance.  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 Chronic bilateral low back pain, unspecified whether sciatica present -She is seeing Dr. Earley Abide with physical medicine rehabilitation for this  4. Chronic pain syndrome -Per #3  5. Nicotine vapor product user -She reports that she is not interested in quitting at this time  This visit occurred  during the SARS-CoV-2 public health emergency.  Safety protocols were in place, including screening questions prior to the visit, additional usage of staff PPE, and extensive cleaning of exam room while observing appropriate contact time as indicated for disinfecting solutions.    Clarene Reamer, FNP-BC  [facility]  08/28/2019 8:02 AM

## 2019-08-28 ENCOUNTER — Encounter: Payer: Self-pay | Admitting: Family Medicine

## 2019-08-29 ENCOUNTER — Encounter: Payer: Self-pay | Admitting: Physical Medicine and Rehabilitation

## 2019-08-29 ENCOUNTER — Encounter: Payer: PPO | Attending: Physical Medicine and Rehabilitation | Admitting: Physical Medicine and Rehabilitation

## 2019-08-29 ENCOUNTER — Other Ambulatory Visit: Payer: Self-pay

## 2019-08-29 VITALS — BP 152/80 | HR 71 | Temp 97.0°F | Ht 62.0 in | Wt 153.0 lb

## 2019-08-29 DIAGNOSIS — G894 Chronic pain syndrome: Secondary | ICD-10-CM | POA: Diagnosis not present

## 2019-08-29 DIAGNOSIS — Z5181 Encounter for therapeutic drug level monitoring: Secondary | ICD-10-CM | POA: Insufficient documentation

## 2019-08-29 DIAGNOSIS — D8940 Mast cell activation, unspecified: Secondary | ICD-10-CM | POA: Insufficient documentation

## 2019-08-29 DIAGNOSIS — M797 Fibromyalgia: Secondary | ICD-10-CM | POA: Insufficient documentation

## 2019-08-29 DIAGNOSIS — Z79891 Long term (current) use of opiate analgesic: Secondary | ICD-10-CM | POA: Diagnosis not present

## 2019-08-29 DIAGNOSIS — M545 Low back pain: Secondary | ICD-10-CM | POA: Insufficient documentation

## 2019-08-29 DIAGNOSIS — M359 Systemic involvement of connective tissue, unspecified: Secondary | ICD-10-CM | POA: Insufficient documentation

## 2019-08-29 DIAGNOSIS — G8929 Other chronic pain: Secondary | ICD-10-CM | POA: Diagnosis not present

## 2019-08-29 MED ORDER — HYDROCODONE-ACETAMINOPHEN 10-325 MG PO TABS: 2.0000 | ORAL_TABLET | Freq: Four times a day (QID) | ORAL | 0 refills | Status: DC | PRN

## 2019-08-29 NOTE — Progress Notes (Signed)
Subjective:    Patient ID: Jasmin [name], female    DOB: [birth year], 74 y.o.   MRN: [id number]  HPI  History obtained from chart review and patient.  [name] is a 74 year old woman with midline cystocele, fibromyalgia, uterovaginal prolapse, CAD, COPD, Dyslipidemia, GERD, palpitations, HTN, vitamin D deficiency, rheumatoid arthritis, who presents for follow-up of chronic lumbosacral pain s/p failed back surgery.   She has been prescribed Norco #112 once a month for 2 years. The Norco 10mg  provides her 4 hours of relief and she takes 8 pills per day.   Sources of pain: neck, low back, shoulders.  Quality of pain: aching, stabbing, throbbing Duration of pain: constant Aggravating factors: bending, twisting, standing, sitting Associated symptoms: numbness, tingling, weakness, stiffness, decreased range of motion  Her pain is debilitating and has caused her to become depressed because of all of her limitations. Her sources of joy are watching TV, going on her ipad, and spending time with her children, grandchildren, and great grandchildren. The Norco helps to control her pain.    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 She has seen an allergist in [place], [place] who prescribed her 4 allergy medications but it was too far for her to keep going there. That is also why she switched pain providers-she loved her formed pain provider, Jeanella Anton, but it was too far for her to go see her.Last visit I referred her to an allergist within [facility] but this was still too far for her to see. She has asked her PCP to refer her to a closer allergist.   Her BP is high in office today, [id number] systolic, and she asks whether this could be related to a stressful argument she had with her husband today.  She feels she may have connective tissue disease as several of her family members have recently been diagnosed with this. She has cold hands, difficulty  swallowing, diffuse pain, shortness of breath. Has not seen a rheumatologist in a long time.   Pain Inventory Average Pain 6 Pain Right Now 5 My pain is sharp, burning, stabbing, tingling and aching  In the last 24 hours, has pain interfered with the following? General activity 10 Relation with others 8 Enjoyment of life 8 What TIME of day is your pain at its worst? morning Sleep (in general) Poor  Pain is worse with: walking, bending, sitting, inactivity and standing Pain improves with: medication Relief from Meds: 5  Mobility how many minutes can you walk? 5 ability to climb steps?  yes do you drive?  yes  Function retired I need assistance with the following:  household duties and shopping  Neuro/Psych bowel control problems weakness numbness tremor tingling trouble walking spasms dizziness depression  Prior Studies Any changes since last visit?  no  Physicians involved in your care Any changes since last visit?  no   Family History  Problem Relation Age of Onset  . Coronary artery disease Father   . Coronary artery disease Mother   . Heart attack Brother   . Coronary artery disease Sister    Social History   Socioeconomic History  . Marital status: Married    Spouse name: Not on file  . Number of children: Not on file  . Years of education: Not on file  . Highest education level: Not on file  Occupational History  . Not on file  Tobacco Use  . Smoking status: Current Every Day  Smoker    Types: E-cigarettes    Last attempt to quit: 01/13/2009    Years since quitting: 10.6  . Smokeless tobacco: Never Used  Substance and Sexual Activity  . Alcohol use: No  . Drug use: No  . Sexual activity: Not Currently  Other Topics Concern  . Not on file  Social History Narrative  . Not on file   Social Determinants of Health   Financial Resource Strain:   . Difficulty of Paying Living Expenses:   Food Insecurity:   . Worried About Sales executive in the Last Year:   . Arboriculturist in the Last Year:   Transportation Needs:   . Film/video editor (Medical):   Marland Kitchen Lack of Transportation (Non-Medical):   Physical Activity:   . Days of Exercise per Week:   . Minutes of Exercise per Session:   Stress:   . Feeling of Stress :   Social Connections:   . Frequency of Communication with Friends and Family:   . Frequency of Social Gatherings with Friends and Family:   . Attends Religious Services:   . Active Member of Clubs or Organizations:   . Attends Archivist Meetings:   Marland Kitchen Marital Status:    Past Surgical History:  Procedure Laterality Date  . PTCA     Past Medical History:  Diagnosis Date  . CAD (coronary artery disease)   . COPD (chronic obstructive pulmonary disease) ([place])   . Dyslipidemia   . GERD (gastroesophageal reflux disease)   . HTN (hypertension)   . Palpitations    BP (!) 152/80   Pulse 71   Temp (!) 97 F (36.1 C)   Ht 5\' 2"  (1.575 m)   Wt 153 lb (69.4 kg)   SpO2 98%   BMI 27.98 kg/m   Opioid Risk Score:   Fall Risk Score:  `1  Depression screen PHQ 2/9  Depression screen PHQ 2/9 07/01/2019  Decreased Interest 3  Down, Depressed, Hopeless 2  PHQ - 2 Score 5  Altered sleeping 3  Tired, decreased energy 3  Change in appetite 2  Feeling bad or failure about yourself  3  Trouble concentrating 2  Moving slowly or fidgety/restless 0  Suicidal thoughts 1  PHQ-9 Score 19    Review of Systems     Objective:   Physical Exam Gen: no distress, normal appearing, appears more comfortable.  HEENT: oral mucosa pink and moist, NCAT Cardio: Reg rate Chest: normal effort, normal rate of breathing Abd: soft, non-distended Ext: no edema Skin: intact Neuro:AOx3 Musculoskeletal:Diffuse weakness that is pain limited. Diffuse tender points in upper and lower body in both sides of the body. Prefers standing position to sitting position.Unable to sit for long without intense pain.  Antalgic gait.  Psych: pleasant, normal affect       Assessment & Plan:  History obtained from chart review and patient.  Mrs. Heisner is a 74 year old woman with midline cystocele, fibromyalgia, uterovaginal prolapse, CAD, COPD, Dyslipidemia, GERD, palpitations, HTN, vitamin D deficiency, rheumatoid arthritis, who presents to establish care for chronic lumbosacral pain.  1) Failed back syndrome: -Pain contract signed previously.  -Urine sample reviewed since last visit and was negative. Hydrocodone #112 ([facility]MED) prescribed today. Discussed that our goal is to wean patient off Norco long term as it is not ideal for chronic pain and she can develop dependence and/or opioid induced hyperanalgesia.   2) Mast cell cytosis -Provided referral to allergist but she states  this is too far for her and she has asked her PCP to provide referral to a closer allergist.  -She has had course of steroids in the past that helped her.  -I think treatment of this syndrome could provide her improved quality of life.   3) Depression -She defers follow up with psychologist or medication treatment at this time. -Dicussed her sources of joy and encouraged her to spend more time with her great grandchildren when possible.   4) Impaired mobility and ADLs -She is able to ambulate less than 200 feet. Renewed handicap placard last visit.   5) Given family history of connective tissue disease and her symptoms of CTD, will provide referral to rheumatology for evaluation.   6) Advised that BP can be elevated in response to stress. Advise that she take her BP at home daily and bring in log to PCP or our next appointment.   RTC in 4 weeks to assess progress with above interventions.

## 2019-09-11 ENCOUNTER — Telehealth: Payer: Self-pay

## 2019-09-11 MED ORDER — HYDROCODONE-ACETAMINOPHEN 10-325 MG PO TABS: 2.0000 | ORAL_TABLET | Freq: Four times a day (QID) | ORAL | 0 refills | Status: DC | PRN

## 2019-09-11 NOTE — Telephone Encounter (Signed)
Patient called requesting refill on Hydrocodone/APAP last filled #112 on 08/29/2019.

## 2019-09-25 ENCOUNTER — Other Ambulatory Visit: Payer: Self-pay

## 2019-09-25 NOTE — Telephone Encounter (Signed)
Pt left v/m; pt established care on 08/25/2019. Pt needs refill on Liothyronine 5 mcg taking one tab twice a day. Please advise.

## 2019-09-25 NOTE — Telephone Encounter (Signed)
Please advise, thanks.

## 2019-09-26 ENCOUNTER — Other Ambulatory Visit: Payer: Self-pay | Admitting: Physical Medicine and Rehabilitation

## 2019-09-26 ENCOUNTER — Encounter: Payer: PPO | Admitting: Physical Medicine and Rehabilitation

## 2019-09-26 ENCOUNTER — Telehealth: Payer: Self-pay | Admitting: *Deleted

## 2019-09-26 MED ORDER — HYDROCODONE-ACETAMINOPHEN 10-325 MG PO TABS: 2.0000 | ORAL_TABLET | Freq: Four times a day (QID) | ORAL | 0 refills | Status: DC | PRN

## 2019-09-26 NOTE — Telephone Encounter (Signed)
Jasmin [name] called and is requesting a refill on her hydrocodone.  According to the PMP , it was refilled 09/13/19 #112. . I verified that she did pick up her last refill. 09/13/19.

## 2019-09-26 NOTE — Telephone Encounter (Signed)
I have sent her renewal; thank you!

## 2019-09-26 NOTE — Telephone Encounter (Signed)
Notified. 

## 2019-09-26 NOTE — Telephone Encounter (Signed)
Please call patient's pharmacy and clarify dose. In chart it states that she is taking this once a day.

## 2019-09-30 ENCOUNTER — Other Ambulatory Visit: Payer: Self-pay | Admitting: Family Medicine

## 2019-09-30 NOTE — Telephone Encounter (Signed)
See other refill note -- need to call pharmacy to verify dosing of medication

## 2019-10-01 MED ORDER — LIOTHYRONINE SODIUM 5 MCG PO TABS: ORAL_TABLET | ORAL | 3 refills | Status: DC

## 2019-10-01 NOTE — Telephone Encounter (Signed)
Message left for patient to return my call to verify dosing of medication.

## 2019-10-01 NOTE — Telephone Encounter (Signed)
Please advise, thanks.

## 2019-10-01 NOTE — Telephone Encounter (Signed)
Patient called back   She stated that she is taking Liothyronine 5 mcg twice a day   She is also wanting to make sure that she receives a 90 day supply

## 2019-10-06 NOTE — Telephone Encounter (Signed)
Please advise if you wish to take over maintenance and prescribing these medications. Previously prescribed by Dr Acie Fredrickson.

## 2019-10-07 ENCOUNTER — Encounter: Payer: PPO | Attending: Physical Medicine and Rehabilitation | Admitting: Physical Medicine and Rehabilitation

## 2019-10-07 ENCOUNTER — Other Ambulatory Visit: Payer: Self-pay

## 2019-10-07 ENCOUNTER — Encounter: Payer: Self-pay | Admitting: Physical Medicine and Rehabilitation

## 2019-10-07 VITALS — BP 145/76 | HR 71 | Temp 96.6°F | Ht 62.0 in | Wt 155.6 lb

## 2019-10-07 DIAGNOSIS — M25561 Pain in right knee: Secondary | ICD-10-CM | POA: Diagnosis not present

## 2019-10-07 DIAGNOSIS — Z79891 Long term (current) use of opiate analgesic: Secondary | ICD-10-CM | POA: Insufficient documentation

## 2019-10-07 DIAGNOSIS — G8929 Other chronic pain: Secondary | ICD-10-CM | POA: Diagnosis not present

## 2019-10-07 DIAGNOSIS — M25562 Pain in left knee: Secondary | ICD-10-CM | POA: Diagnosis not present

## 2019-10-07 DIAGNOSIS — Z5181 Encounter for therapeutic drug level monitoring: Secondary | ICD-10-CM | POA: Diagnosis not present

## 2019-10-07 DIAGNOSIS — D8940 Mast cell activation, unspecified: Secondary | ICD-10-CM

## 2019-10-07 DIAGNOSIS — M797 Fibromyalgia: Secondary | ICD-10-CM | POA: Insufficient documentation

## 2019-10-07 DIAGNOSIS — M359 Systemic involvement of connective tissue, unspecified: Secondary | ICD-10-CM | POA: Insufficient documentation

## 2019-10-07 DIAGNOSIS — G894 Chronic pain syndrome: Secondary | ICD-10-CM | POA: Insufficient documentation

## 2019-10-07 DIAGNOSIS — M545 Low back pain, unspecified: Secondary | ICD-10-CM

## 2019-10-07 MED ORDER — DICLOFENAC SODIUM 1 % EX GEL: 2.0000 g | Freq: Four times a day (QID) | CUTANEOUS | 3 refills | Status: DC

## 2019-10-07 MED ORDER — LISINOPRIL 20 MG PO TABS: 20.0000 mg | ORAL_TABLET | Freq: Every day | ORAL | 3 refills | Status: DC

## 2019-10-07 MED ORDER — METOPROLOL SUCCINATE ER 50 MG PO TB24: 50.0000 mg | ORAL_TABLET | Freq: Every day | ORAL | 3 refills | Status: DC

## 2019-10-07 MED ORDER — PREDNISONE 1 MG PO TABS: 1.0000 mg | ORAL_TABLET | Freq: Every day | ORAL | 0 refills | Status: DC | PRN

## 2019-10-07 MED ORDER — CLOPIDOGREL BISULFATE 75 MG PO TABS: 75.0000 mg | ORAL_TABLET | Freq: Every day | ORAL | 3 refills | Status: DC

## 2019-10-07 MED ORDER — EUCERIN EX CREA
TOPICAL_CREAM | CUTANEOUS | 0 refills | Status: DC | PRN
Start: 2019-10-07 — End: 2022-05-19

## 2019-10-07 MED ORDER — HYDROCODONE-ACETAMINOPHEN 10-325 MG PO TABS: 2.0000 | ORAL_TABLET | Freq: Four times a day (QID) | ORAL | 0 refills | Status: DC | PRN

## 2019-10-07 NOTE — Progress Notes (Signed)
Subjective:    Patient ID: Jasmin [name], female    DOB: 1946-05-15, 74 y.o.   MRN: [id number]  HPI  History obtained from chart review and patient.  Jasmin [name] is a 74 year old woman with midline cystocele, fibromyalgia, uterovaginal prolapse, CAD, COPD, Dyslipidemia, GERD, palpitations, HTN, vitamin D deficiency, rheumatoid arthritis, who presents for follow-up of chronic lumbosacral pain s/p failed back surgery.    We are prescribing Norco #224 pills for one month for her pain. She has been on this dose for many years and tolerates it well. The Norco 10mg  provides her 4 hours of relief and she takes 8 pills per day.    Sources of pain: neck, low back, shoulders.  Quality of pain: aching, stabbing, throbbing Duration of pain: constant Aggravating factors: bending, twisting, standing, sitting Associated symptoms: numbness, tingling, weakness, stiffness, decreased range of motion   Her pain is debilitating and has caused her to become depressed because of all of her limitations. Her sources of joy are watching TV, going on her ipad, and spending time with her children, grandchildren, and great grandchildren. The Norco helps to control her pain.     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 She shares her genetic results with me. She has seen an allergist in [place], [place] who prescribed her 4 allergy medications but it was too far for her to keep going there. That is also why she switched pain providers-she loved her formed pain provider, Jasmin [name], but it was too far for her to go see her. Previously  I referred her to an allergist within [facility] but this was still too far for her to see. She has asked her PCP to refer her to a closer allergist.    Her BP is better controlled today. She needs a refill of her Lisinopril and metoprolol. I have provided these. She has asked for a referral to a new PCP as has been having trouble getting in  touch with current practice.    She feels she may have connective tissue disease as several of her family members have recently been diagnosed with this. She has cold hands, difficulty swallowing, diffuse pain, shortness of breath. Has not seen a rheumatologist in a long time. I have provided referral and she has an appointment to see one in September.   Pain Inventory Average Pain 9 Pain Right Now 9 My pain is sharp, burning, dull, stabbing, tingling and aching  In the last 24 hours, has pain interfered with the following? General activity 9 Relation with others 9 Enjoyment of life 10 What TIME of day is your pain at its worst? all Sleep (in general) Poor  Pain is worse with: some activites Pain improves with: medication Relief from Meds: 5  Mobility walk with assistance how many minutes can you walk? 3-5 do you drive?  yes  Function retired  Neuro/Psych No problems in this area  Prior Studies Any changes since last visit?  no  Physicians involved in your care Any changes since last visit?  no   Family History  Problem Relation Age of Onset  . Coronary artery disease Father   . Coronary artery disease Mother   . Heart attack Brother   . Coronary artery disease Sister    Social History   Socioeconomic History  . Marital status: Married    Spouse name: Not on file  . Number of children: Not on file  . Years  of education: Not on file  . Highest education level: Not on file  Occupational History  . Not on file  Tobacco Use  . Smoking status: Current Every Day Smoker    Types: E-cigarettes    Last attempt to quit: 01/13/2009    Years since quitting: 10.7  . Smokeless tobacco: Never Used  Substance and Sexual Activity  . Alcohol use: No  . Drug use: No  . Sexual activity: Not Currently  Other Topics Concern  . Not on file  Social History Narrative  . Not on file   Social Determinants of Health   Financial Resource Strain:   . Difficulty of Paying  Living Expenses:   Food Insecurity:   . Worried About Charity fundraiser in the Last Year:   . Arboriculturist in the Last Year:   Transportation Needs:   . Film/video editor (Medical):   Marland Kitchen Lack of Transportation (Non-Medical):   Physical Activity:   . Days of Exercise per Week:   . Minutes of Exercise per Session:   Stress:   . Feeling of Stress :   Social Connections:   . Frequency of Communication with Friends and Family:   . Frequency of Social Gatherings with Friends and Family:   . Attends Religious Services:   . Active Member of Clubs or Organizations:   . Attends Archivist Meetings:   Marland Kitchen Marital Status:    Past Surgical History:  Procedure Laterality Date  . PTCA     Past Medical History:  Diagnosis Date  . CAD (coronary artery disease)   . COPD (chronic obstructive pulmonary disease) ([place])   . Dyslipidemia   . GERD (gastroesophageal reflux disease)   . HTN (hypertension)   . Palpitations    BP (!) 145/76   Pulse 71   Temp (!) 96.6 F (35.9 C)   Ht 5\' 2"  (1.575 m)   Wt 155 lb 9.6 oz (70.6 kg)   SpO2 98%   BMI 28.46 kg/m   Opioid Risk Score:   Fall Risk Score:  `1  Depression screen PHQ 2/9  Depression screen [facility] 2/9 10/07/2019 07/01/2019  Decreased Interest 3 3  Down, Depressed, Hopeless 2 2  PHQ - 2 Score 5 5  Altered sleeping - 3  Tired, decreased energy - 3  Change in appetite - 2  Feeling bad or failure about yourself  - 3  Trouble concentrating - 2  Moving slowly or fidgety/restless - 0  Suicidal thoughts - 1  PHQ-9 Score - 19    Review of Systems  Constitutional: Negative.   HENT: Negative.   Eyes: Negative.   Respiratory: Negative.   Cardiovascular: Positive for leg swelling.  Gastrointestinal: Positive for diarrhea.  Endocrine: Negative.   Genitourinary: Negative.   Musculoskeletal: Negative.   Skin: Positive for rash.  Allergic/Immunologic: Negative.   Neurological: Negative.   Hematological: Bruises/bleeds  easily.       Plavix  Psychiatric/Behavioral: Positive for dysphoric mood.  All other systems reviewed and are negative.      Objective:   Physical Exam Gen: no distress, normal appearing, appears more comfortable.  HEENT: oral mucosa pink and moist, NCAT Cardio: Reg rate Chest: normal effort, normal rate of breathing Abd: soft, non-distended Ext: no edema Skin: intact Neuro: AOx3 Musculoskeletal: Diffuse weakness that is pain limited. Diffuse tender points in upper and lower body in both sides of the body. Prefers standing position to sitting position. Unable to sit for long  without intense pain. Antalgic gait.  Psych: pleasant, normal affect     Assessment & Plan:  History obtained from chart review and patient.  Jasmin [name] is a 74 year old woman with midline cystocele, fibromyalgia, uterovaginal prolapse, CAD, COPD, Dyslipidemia, GERD, palpitations, HTN, vitamin D deficiency, rheumatoid arthritis, who presents to establish care for chronic lumbosacral pain.   1) Failed back syndrome: -Pain contract signed previously.  -Urine sample reviewed previously and was negative. Hydrocodone #224 for 1 month was (80MED) prescribed today. Discussed that our goal is to wean patient off Norco long term as it is not ideal for chronic pain and she can develop dependence and/or opioid induced hyperanalgesia.    2) Mast cell cytosis -Provided referral to allergist but she states this is too far for her and she has asked her PCP to provide referral to a closer allergist.  -She has had course of steroids in the past that helped her.  -I think treatment of this syndrome could provide her improved quality of life.  -Reviewed her genetic analyses today. Will prescribe 1mg  prednisone 5 tablets for her to use when symptoms are particularly severe and to assess benefit for mast cell cytosis. Advised regarding risks of steroids.    3) Depression -She defers follow up with psychologist or medication  treatment at this time. -Dicussed her sources of joy and encouraged her to spend more time with her great grandchildren when possible.    4) Impaired mobility and ADLs -She is able to ambulate less than 200 feet. Renewed handicap placard previously.    5) Given family history of connective tissue disease and her symptoms of CTD, have referred to rheumatology. She has appointment in September.    6) Advised that BP can be elevated in response to stress. Advise that she take her BP at home daily and bring in log to PCP or our next appointment. Renewed her Lisinopril 20mg  and Metoprolol 50mg . HR is well controlled in office and BP is slightly elevated.   7) Bilateral knee pain: Prescribed Diclofenac gel.   8) Dry skin in groin and under breasts: prescribed eucerin cream.   RTC in 4 weeks to assess progress with above interventions.

## 2019-10-07 NOTE — Telephone Encounter (Signed)
Please call patient's pharmacy and verify if these are current medications.

## 2019-10-08 ENCOUNTER — Other Ambulatory Visit: Payer: Self-pay | Admitting: *Deleted

## 2019-10-08 MED ORDER — DICLOFENAC SODIUM 1 % EX GEL: 2.0000 g | Freq: Four times a day (QID) | CUTANEOUS | 3 refills | Status: DC

## 2019-10-09 ENCOUNTER — Telehealth: Payer: Self-pay

## 2019-10-09 NOTE — Telephone Encounter (Signed)
Patient called requesting a refill on Hydrocodone, last filled #112 on 09/26/2019.

## 2019-10-09 NOTE — Telephone Encounter (Signed)
Medications refilled by Izora Ribas, MD on 10/07/2019 for #30 x 3 refills.  Nothing further needed.

## 2019-10-10 ENCOUNTER — Other Ambulatory Visit: Payer: Self-pay | Admitting: Physical Medicine and Rehabilitation

## 2019-10-10 MED ORDER — HYDROCODONE-ACETAMINOPHEN 10-325 MG PO TABS: 2.0000 | ORAL_TABLET | Freq: Four times a day (QID) | ORAL | 0 refills | Status: DC | PRN

## 2019-10-10 NOTE — Telephone Encounter (Signed)
Sent! I had sent it at her appointment 3 days ago but it does not appear to have gone through, so resent now. Thank you!

## 2019-10-16 ENCOUNTER — Other Ambulatory Visit: Payer: Self-pay | Admitting: Physical Medicine and Rehabilitation

## 2019-10-24 ENCOUNTER — Other Ambulatory Visit: Payer: Self-pay | Admitting: Physical Medicine and Rehabilitation

## 2019-10-24 NOTE — Telephone Encounter (Signed)
Refill request Lisinopril. Okay to refill?

## 2019-10-27 ENCOUNTER — Telehealth: Payer: Self-pay | Admitting: *Deleted

## 2019-10-27 NOTE — Telephone Encounter (Signed)
No refill at this time; thank you!

## 2019-10-27 NOTE — Telephone Encounter (Signed)
Pharmacy is requesting a refill on Jasmin [name]'s prednisone 1 mg tablets #5. It came in as a fax. Please advise.

## 2019-11-05 ENCOUNTER — Encounter: Payer: PPO | Attending: Physical Medicine and Rehabilitation | Admitting: Physical Medicine and Rehabilitation

## 2019-11-05 ENCOUNTER — Encounter: Payer: Self-pay | Admitting: Physical Medicine and Rehabilitation

## 2019-11-05 ENCOUNTER — Other Ambulatory Visit: Payer: Self-pay

## 2019-11-05 VITALS — BP 151/77 | HR 70 | Ht 62.0 in | Wt 155.2 lb

## 2019-11-05 DIAGNOSIS — Z79899 Other long term (current) drug therapy: Secondary | ICD-10-CM

## 2019-11-05 DIAGNOSIS — M359 Systemic involvement of connective tissue, unspecified: Secondary | ICD-10-CM | POA: Insufficient documentation

## 2019-11-05 DIAGNOSIS — Z5181 Encounter for therapeutic drug level monitoring: Secondary | ICD-10-CM | POA: Diagnosis not present

## 2019-11-05 DIAGNOSIS — M545 Low back pain: Secondary | ICD-10-CM | POA: Diagnosis not present

## 2019-11-05 DIAGNOSIS — D8940 Mast cell activation, unspecified: Secondary | ICD-10-CM | POA: Insufficient documentation

## 2019-11-05 DIAGNOSIS — G894 Chronic pain syndrome: Secondary | ICD-10-CM | POA: Diagnosis not present

## 2019-11-05 DIAGNOSIS — G8929 Other chronic pain: Secondary | ICD-10-CM | POA: Insufficient documentation

## 2019-11-05 DIAGNOSIS — M797 Fibromyalgia: Secondary | ICD-10-CM | POA: Insufficient documentation

## 2019-11-05 DIAGNOSIS — Z79891 Long term (current) use of opiate analgesic: Secondary | ICD-10-CM | POA: Diagnosis not present

## 2019-11-05 MED ORDER — HYDROCODONE-ACETAMINOPHEN 10-325 MG PO TABS: 2.0000 | ORAL_TABLET | Freq: Four times a day (QID) | ORAL | 0 refills | Status: DC | PRN

## 2019-11-05 NOTE — Progress Notes (Signed)
Subjective:    Patient ID: Jasmin [name], female    DOB: 07/16/1945, 74 y.o.   MRN: [id number]  HPI  History obtained from chart review and patient.  Jasmin [name] is a 74 year old woman with midline cystocele, fibromyalgia, uterovaginal prolapse, CAD, COPD, Dyslipidemia, GERD, palpitations, HTN, vitamin D deficiency, rheumatoid arthritis, who presentsfor follow-up ofchronic lumbosacral pains/p failed back surgery.  We are prescribing Norco #224 pills for one month for her pain. She has been on this dose for many years and tolerates it well. The Norco 38m provides her 4 hours of relief and she takes8pills per day.   Sources of pain: neck, low back, shoulders.  Quality of pain: aching, stabbing, throbbing Duration of pain: constant Aggravating factors: bending, twisting, standing, sitting Associated symptoms: numbness, tingling, weakness, stiffness, decreased range of motion  Her pain is debilitating and has caused her to become depressed because of all of her limitations. Her sources of joy are watching TV, going on her ipad, and spending time with her children, grandchildren, and great grandchildren.The Norco helps to control her pain.Her mood has been MUCH more positive since when I first met her.   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 She shares her genetic results with me. [place] was too far for her to keep going there. That is also why she switched pain providers-she loved her formed pain provider, Jasmin [name] but it was too far for her to go see her.Previously  [facility] for her to see.She has asked her PCP to refer her to a closer allergist. Last visit I prescribed her 5 pills of prednisone 75mand she found incredible relief from this. She is wary of side effects of  prednisone.   Her BP is elevated again today.  Last visit I provided her refill of Lisinopril and Metoprolol. She has not been checking her blood pressures at home. She has asked for a referral to a new PCP as has been having trouble getting in touch with current practice.   She feels she may have connective tissue disease as several of her family members have recently been diagnosed with this. She has cold hands, difficulty swallowing, diffuse pain, shortness of breath. Has not seen a rheumatologist in a long time.I have provided referral and she has an appointment to see one in September.   Pain Inventory Average Pain 9 Pain Right Now 9 My pain is sharp, burning, dull, stabbing, tingling and aching  In the last 24 hours, has pain interfered with the following? General activity 9 Relation with others 9 Enjoyment of life 10 What TIME of day is your pain at its worst? all Sleep (in general) Poor  Pain is worse with: some activites Pain improves with: medication Relief from Meds: 5  Mobility walk without assistance how many minutes can you walk? 3-5 do you drive?  yes  Function retired  Neuro/Psych No problems in this area  Prior Studies Any changes since last visit?  no  Physicians involved in your care Any changes since last visit?  no   Family History  Problem Relation Age of Onset  . Coronary artery disease Father   . Coronary artery disease Mother   . Heart attack Brother   . Coronary artery disease Sister    Social History  Socioeconomic History  . Marital status: Married    Spouse name: Not on file  . Number of children: Not on file  . Years of education: Not on file  . Highest education level: Not on file  Occupational History  . Not on file  Tobacco Use  . Smoking status: Current Every Day Smoker    Types: E-cigarettes    Last attempt to quit: 01/13/2009    Years since quitting: 10.8  . Smokeless tobacco: Never Used  Substance and Sexual  Activity  . Alcohol use: No  . Drug use: No  . Sexual activity: Not Currently  Other Topics Concern  . Not on file  Social History Narrative  . Not on file   Social Determinants of Health   Financial Resource Strain:   . Difficulty of Paying Living Expenses:   Food Insecurity:   . Worried About Charity fundraiser in the Last Year:   . Arboriculturist in the Last Year:   Transportation Needs:   . Film/video editor (Medical):   Marland Kitchen Lack of Transportation (Non-Medical):   Physical Activity:   . Days of Exercise per Week:   . Minutes of Exercise per Session:   Stress:   . Feeling of Stress :   Social Connections:   . Frequency of Communication with Friends and Family:   . Frequency of Social Gatherings with Friends and Family:   . Attends Religious Services:   . Active Member of Clubs or Organizations:   . Attends Archivist Meetings:   Marland Kitchen Marital Status:    Past Surgical History:  Procedure Laterality Date  . PTCA     Past Medical History:  Diagnosis Date  . CAD (coronary artery disease)   . COPD (chronic obstructive pulmonary disease) ([place])   . Dyslipidemia   . GERD (gastroesophageal reflux disease)   . HTN (hypertension)   . Palpitations    There were no vitals taken for this visit.  Opioid Risk Score:   Fall Risk Score:  `1  Depression screen PHQ 2/9  Depression screen [facility] 2/9 11/05/2019 10/07/2019 07/01/2019  Decreased Interest _0 Down, Depressed, Hopeless _1 PHQ - 2 Score _2 Altered sleeping - - 3  Tired, decreased energy - - 3  Change in appetite - - 2  Feeling bad or failure about yourself  - - 3  Trouble concentrating - - 2  Moving slowly or fidgety/restless - - 0  Suicidal thoughts - - 1  PHQ-9 Score - - 19   Review of Systems  Constitutional: Negative.   HENT: Negative.   Eyes: Negative.   Respiratory: Negative.   Cardiovascular: Negative.   Gastrointestinal: Negative.   Endocrine: Negative.   Genitourinary:  Negative.   Musculoskeletal: Negative.   Skin: Negative.   Allergic/Immunologic: Negative.   Neurological: Negative.   Hematological: Negative.   Psychiatric/Behavioral: Negative.   All other systems reviewed and are negative.      Objective:   Physical Exam Gen: no distress, normal appearing HEENT: oral mucosa pink and moist, NCAT Cardio: Reg rate Chest: normal effort, normal rate of breathing Abd: soft, non-distended Ext: no edema Skin: intact Neuro:AOx3 Musculoskeletal:Diffuse weakness that is pain limited. Diffuse tender points in upper and lower body in both sides of the body. Prefers standing position to sitting position.Unable to sit for long without intense pain. Antalgic gait. Psych: pleasant, normal affect    Assessment & Plan:  History obtained from chart review and patient.  Mrs. Forstrom is a 74 year old woman with midline cystocele, fibromyalgia, uterovaginal prolapse, CAD, COPD, Dyslipidemia, GERD, palpitations, HTN, vitamin D deficiency, rheumatoid arthritis, who presents to establish care for chronic lumbosacral pain.  1) Failed back syndrome: -Pain contract signedpreviously. -Urine samplereviewed previously and was negative. Routine urine screen repeated today. Hydrocodone #224 for 1 month was (80MED) prescribed today.Discussed that our goal is to wean patient off Norco long term as it is not ideal for chronic pain and she can develop dependence and/or opioid induced hyperanalgesia. Can potentially use steroids as an alternative as she had excellent response with this, but also don't want to expose her to the dangers of steroids when she is currently doing very well with Norco without side effects.   2) Mast cell cytosis -Provided referral to allergistbut she states this is too far for herand she has asked her PCP to provide referral to a closer allergist. -She has had course of steroids in the past that helped her.  -I think treatment of this syndrome  could provide her improved quality of life.  -Reviewed her genetic analyses on 10/07/19. Last visit prescribe 2m prednisone 5 tablets for her to use when symptoms are particularly severe and to assess benefit for mast cell cytosis. She responded very well to this. Advised regarding risks of steroids. Can use in the future if pain worsens or to help wean off Norco.   3) Depression -She defers follow up with psychologist or medication treatment at this time. -Dicussed her sources of joy and encouraged her to spend more time with her great grandchildren when possible.  4) Impaired mobility and ADLs -She is able to ambulateless than 200 feet. Renewed handicap placardpreviously.   5) Given family history of connective tissue disease and her symptoms of CTD, have referred to rheumatology. She has appointment in September.   6) Advised that BP can be elevated in response to stress. Advise that she take her BP at home daily and bring in log to PCP or our next appointment. She did  not bring in today but will next time.Renewed her Lisinopril 288mand Metoprolol 5022mn 10/07/19. HR is well controlled in office and BP is slightly elevated. Advised regarding high potassium foods such as bananas to help lower BP.   7) Bilateral knee pain: Prescribed Diclofenac gel.   8) Dry skin in groin and under breasts: prescribed eucerin cream.  RTC in 2 months to assess progress with above interventions.

## 2019-11-06 ENCOUNTER — Encounter: Payer: Self-pay | Admitting: Physical Medicine and Rehabilitation

## 2019-11-11 LAB — DRUG TOX MONITOR 1 W/CONF, ORAL FLD
Amphetamines: NEGATIVE ng/mL (ref <10)
Barbiturates: NEGATIVE ng/mL (ref <10)
Benzodiazepines: NEGATIVE ng/mL (ref <0.50)
Buprenorphine: NEGATIVE ng/mL (ref <0.10)
Cocaine: NEGATIVE ng/mL (ref <5.0)
Cotinine: 100.1 ng/mL — ABNORMAL HIGH (ref <5.0)
Dihydrocodeine: 139.2 ng/mL — ABNORMAL HIGH (ref <2.5)
Fentanyl: NEGATIVE ng/mL (ref <0.10)
Heroin Metabolite: NEGATIVE ng/mL (ref <1.0)
Hydrocodone: >250 ng/mL — ABNORMAL HIGH (ref <2.5)
Hydromorphone: NEGATIVE ng/mL (ref <2.5)
MARIJUANA: NEGATIVE ng/mL (ref <2.5)
MDMA: NEGATIVE ng/mL (ref <10)
Meprobamate: NEGATIVE ng/mL (ref <2.5)
Methadone: NEGATIVE ng/mL (ref <5.0)
Morphine: NEGATIVE ng/mL (ref <2.5)
Nicotine Metabolite: POSITIVE ng/mL — AB (ref <5.0)
Norhydrocodone: 98.3 ng/mL — ABNORMAL HIGH (ref <2.5)
Noroxycodone: NEGATIVE ng/mL (ref <2.5)
Opiates: POSITIVE ng/mL — AB (ref <2.5)
Oxycodone: NEGATIVE ng/mL (ref <2.5)
Oxymorphone: NEGATIVE ng/mL (ref <2.5)
Phencyclidine: NEGATIVE ng/mL (ref <10)
Tapentadol: NEGATIVE ng/mL (ref <5.0)
Zolpidem: NEGATIVE ng/mL (ref <5.0)

## 2019-11-11 LAB — DRUG TOX ALC METAB W/CON, ORAL FLD: Alcohol Metabolite: NEGATIVE ng/mL (ref <25)

## 2019-11-21 ENCOUNTER — Telehealth: Payer: Self-pay | Admitting: *Deleted

## 2019-11-21 NOTE — Telephone Encounter (Signed)
Oral swab drug screen was consistent for prescribed medications.  ?

## 2020-01-01 ENCOUNTER — Other Ambulatory Visit: Payer: Self-pay

## 2020-01-01 MED ORDER — HYDROCODONE-ACETAMINOPHEN 10-325 MG PO TABS: 2.0000 | ORAL_TABLET | Freq: Four times a day (QID) | ORAL | 0 refills | Status: DC | PRN

## 2020-01-07 ENCOUNTER — Encounter: Payer: PPO | Admitting: Physical Medicine and Rehabilitation

## 2020-01-08 NOTE — Progress Notes (Deleted)
Office Visit Note  Patient: Jasmin [name]             Date of Birth: 1945/08/30           MRN: [id number]             PCP: Emi Belfast, FNP Referring: Emi Belfast, FNP Visit Date: 01/22/2020 Occupation: @GUAROCC @  Subjective:  No chief complaint on file.   History of Present Illness: Jasmin [name] is a 74 y.o. female ***   Activities of Daily Living:  Patient reports morning stiffness for *** {minute/hour:19697}.   Patient {ACTIONS;DENIES/REPORTS:21021675::"Denies"} nocturnal pain.  Difficulty dressing/grooming: {ACTIONS;DENIES/REPORTS:21021675::"Denies"} Difficulty climbing stairs: {ACTIONS;DENIES/REPORTS:21021675::"Denies"} Difficulty getting out of chair: {ACTIONS;DENIES/REPORTS:21021675::"Denies"} Difficulty using hands for taps, buttons, cutlery, and/or writing: {ACTIONS;DENIES/REPORTS:21021675::"Denies"}  No Rheumatology ROS completed.   PMFS History:  There are no problems to display for this patient.   Past Medical History:  Diagnosis Date  . CAD (coronary artery disease)   . COPD (chronic obstructive pulmonary disease) ([facility])   . Dyslipidemia   . GERD (gastroesophageal reflux disease)   . HTN (hypertension)   . Palpitations     Family History  Problem Relation Age of Onset  . Coronary artery disease Father   . Coronary artery disease Mother   . Heart attack Brother   . Coronary artery disease Sister    Past Surgical History:  Procedure Laterality Date  . PTCA     Social History   Social History Narrative  . Not on file    There is no immunization history on file for this patient.   Objective: Vital Signs: There were no vitals taken for this visit.   Physical Exam   Musculoskeletal Exam: ***  CDAI Exam: CDAI Score: -- Patient Global: --; Provider Global: -- Swollen: --; Tender: -- Joint Exam 01/22/2020   No joint exam has been documented for this visit   There is currently no information documented on the homunculus. Go  to the Rheumatology activity and complete the homunculus joint exam.  Investigation: No additional findings.  Imaging: No results found.  Recent Labs: Lab Results  Component Value Date   WBC 6.5 08/29/2008   HGB 12.2 08/29/2008   PLT 203 08/29/2008   NA 140 08/29/2008   K 4.2 08/29/2008   CL 104 08/29/2008   CO2 30 08/29/2008   GLUCOSE 93 08/29/2008   BUN 10 08/29/2008   CREATININE 0.73 08/29/2008   BILITOT QUANTITY NOT SUFFICIENT, UNABLE TO PERFORM TEST 06/08/2008   ALKPHOS 93 06/08/2008   AST QUANTITY NOT SUFFICIENT, UNABLE TO PERFORM TEST 06/08/2008   ALT 26 06/08/2008   PROT 6.9 06/08/2008   ALBUMIN 4.2 06/08/2008   CALCIUM 9.1 08/29/2008   GFRAA  08/29/2008    >60        The eGFR has been calculated using the MDRD equation. This calculation has not been validated in all clinical situations. eGFR's persistently <60 mL/min signify possible Chronic Kidney Disease.    Speciality Comments: No specialty comments available.  Procedures:  No procedures performed Allergies: Crestor [rosuvastatin]   Assessment / Plan:     Visit Diagnoses: No diagnosis found.  Orders: No orders of the defined types were placed in this encounter.  No orders of the defined types were placed in this encounter.   Face-to-face time spent with patient was *** minutes. Greater than 50% of time was spent in counseling and coordination of care.  Follow-Up Instructions: No follow-ups on file.   08/31/2008,  PA-C  Note - This record has been created using [employer].  Chart creation errors have been sought, but may not always  have been located. Such creation errors do not reflect on  the standard of medical care.

## 2020-01-20 ENCOUNTER — Encounter: Payer: PPO | Attending: Physical Medicine and Rehabilitation | Admitting: Physical Medicine and Rehabilitation

## 2020-01-20 ENCOUNTER — Encounter: Payer: Self-pay | Admitting: Physical Medicine and Rehabilitation

## 2020-01-20 ENCOUNTER — Other Ambulatory Visit: Payer: Self-pay

## 2020-01-20 VITALS — BP 143/79 | HR 69 | Temp 98.1°F | Ht 60.0 in | Wt 155.0 lb

## 2020-01-20 DIAGNOSIS — G8929 Other chronic pain: Secondary | ICD-10-CM | POA: Insufficient documentation

## 2020-01-20 DIAGNOSIS — M25561 Pain in right knee: Secondary | ICD-10-CM

## 2020-01-20 DIAGNOSIS — D8940 Mast cell activation, unspecified: Secondary | ICD-10-CM | POA: Diagnosis not present

## 2020-01-20 DIAGNOSIS — Z5181 Encounter for therapeutic drug level monitoring: Secondary | ICD-10-CM | POA: Diagnosis not present

## 2020-01-20 DIAGNOSIS — M797 Fibromyalgia: Secondary | ICD-10-CM

## 2020-01-20 DIAGNOSIS — Z79899 Other long term (current) drug therapy: Secondary | ICD-10-CM | POA: Diagnosis not present

## 2020-01-20 DIAGNOSIS — Z79891 Long term (current) use of opiate analgesic: Secondary | ICD-10-CM | POA: Diagnosis not present

## 2020-01-20 DIAGNOSIS — M961 Postlaminectomy syndrome, not elsewhere classified: Secondary | ICD-10-CM | POA: Diagnosis not present

## 2020-01-20 DIAGNOSIS — M25562 Pain in left knee: Secondary | ICD-10-CM | POA: Diagnosis not present

## 2020-01-20 DIAGNOSIS — G894 Chronic pain syndrome: Secondary | ICD-10-CM

## 2020-01-20 DIAGNOSIS — M545 Low back pain: Secondary | ICD-10-CM | POA: Insufficient documentation

## 2020-01-20 DIAGNOSIS — M359 Systemic involvement of connective tissue, unspecified: Secondary | ICD-10-CM | POA: Diagnosis not present

## 2020-01-20 MED ORDER — HYDROXYCHLOROQUINE SULFATE 200 MG PO TABS
200.0000 mg | ORAL_TABLET | Freq: Every day | ORAL | 1 refills | Status: DC
Start: 2020-01-20 — End: 2022-05-19

## 2020-01-20 MED ORDER — HYDROCODONE-ACETAMINOPHEN 10-325 MG PO TABS: 2.0000 | ORAL_TABLET | Freq: Four times a day (QID) | ORAL | 0 refills | Status: DC | PRN

## 2020-01-20 MED ORDER — DICLOFENAC SODIUM 1 % EX GEL: 2.0000 g | Freq: Four times a day (QID) | CUTANEOUS | 3 refills | Status: DC

## 2020-01-20 NOTE — Progress Notes (Signed)
Subjective:    Patient ID: Jasmin [name], female    DOB: 04-Dec-1945, 74 y.o.   MRN: [id number]  HPI  History obtained from chart review and patient.  Jasmin [name] is a 74 year old woman with midline cystocele, fibromyalgia, uterovaginal prolapse, CAD, COPD, Dyslipidemia, GERD, palpitations, HTN, vitamin D deficiency, rheumatoid arthritis, who presentsfor follow-up ofchronic lumbosacral pains/p failed back surgery.  Since last visit she has been having a lot of pain in her joints. She does not put any gels or creams on this.   She has not been able to see her rheumatologist in 3 years. She was trialed on Methotrexate in the past. She did benefit from low dose prednisone I prescribed last visit.   The Eucerin she has been using below her breasts has been very helpful.  We are prescribing Norco #224 pills for one month for her pain. She has been on this dose for many years and tolerates it well. The Norco 31m provides her 4 hours of relief and she takes8pills per day. Will continue this dose.   Sources of pain: neck, low back, shoulders.  Quality of pain: aching, stabbing, throbbing Duration of pain: constant Aggravating factors: bending, twisting, standing, sitting Associated symptoms: numbness, tingling, weakness, stiffness, decreased range of motion  Her pain is debilitating and has caused her to become depressed because of all of her limitations. Her sources of joy are watching TV, going on her ipad, and spending time with her children, grandchildren, and great grandchildren.The Norco helps to control her pain.Her mood has been MUCH more positive since when I first met her.   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 She shares her genetic results with me. [place] too far for her to keep going there. That is also why she switched pain  providers-she loved her formed pain provider, JJeanella Anton but it was too far for her to go see her.Previously  [facility] [place] [facility] [place] [facility] [place] to [facility].She has asked her PCP to refer her to a closer allergist. Previously I prescribed her 5 pills of prednisone 75mand she found incredible relief from this. She is wary of side effects of prednisone.   She feels she may have connective tissue disease as several of her family members have recently been diagnosed with this. She has cold hands, difficulty swallowing, diffuse pain, shortness of breath. Has not seen a rheumatologist in a long time.I have provided referral and she has an appointment to see one in September.   She has not been able to walk much due to the severity of her pain. She was able to get to today's appointment.   She also asks about COVID testing. She believes she got COVID early in the pandemic. She has not been vaccinated as is worried about how her body will respond to the vaccine.   Pain Inventory Average Pain 8 Pain Right Now 9 My pain is sharp, burning, dull, stabbing, tingling and aching  In the last 24 hours, has pain interfered with the following? General activity 10 Relation with others 10 Enjoyment of life 10 What TIME of day is your pain at its worst? all Sleep (in general) Poor  Pain is worse with: walking, bending, sitting, inactivity and standing Pain improves with: medication Relief from Meds: 6  Mobility  walk without assistance how many minutes can you walk? 3-5 do you drive?  yes  Function retired  Neuro/Psych No problems in this area  Prior Studies Any changes since last visit?  no  Physicians involved in your care Any changes since last visit?  no   Family History  Problem Relation Age of Onset  . Coronary artery disease Father   . Coronary artery disease Mother   . Heart attack Brother   . Coronary artery disease Sister     Social History   Socioeconomic History  . Marital status: Married    Spouse name: Not on file  . Number of children: Not on file  . Years of education: Not on file  . Highest education level: Not on file  Occupational History  . Not on file  Tobacco Use  . Smoking status: Current Every Day Smoker    Types: E-cigarettes    Last attempt to quit: 01/13/2009    Years since quitting: 11.0  . Smokeless tobacco: Never Used  Substance and Sexual Activity  . Alcohol use: No  . Drug use: No  . Sexual activity: Not Currently  Other Topics Concern  . Not on file  Social History Narrative  . Not on file   Social Determinants of Health   Financial Resource Strain:   . Difficulty of Paying Living Expenses: Not on file  Food Insecurity:   . Worried About Charity fundraiser in the Last Year: Not on file  . Ran Out of Food in the Last Year: Not on file  Transportation Needs:   . Lack of Transportation (Medical): Not on file  . Lack of Transportation (Non-Medical): Not on file  Physical Activity:   . Days of Exercise per Week: Not on file  . Minutes of Exercise per Session: Not on file  Stress:   . Feeling of Stress : Not on file  Social Connections:   . Frequency of Communication with Friends and Family: Not on file  . Frequency of Social Gatherings with Friends and Family: Not on file  . Attends Religious Services: Not on file  . Active Member of Clubs or Organizations: Not on file  . Attends Archivist Meetings: Not on file  . Marital Status: Not on file   Past Surgical History:  Procedure Laterality Date  . PTCA     Past Medical History:  Diagnosis Date  . CAD (coronary artery disease)   . COPD (chronic obstructive pulmonary disease) ([place])   . Dyslipidemia   . GERD (gastroesophageal reflux disease)   . HTN (hypertension)   . Palpitations    There were no vitals taken for this visit.  Opioid Risk Score:   Fall Risk Score:  `1  Depression screen PHQ  2/9  Depression screen [facility] 2/9 11/05/2019 10/07/2019 07/01/2019  Decreased Interest 3 3 3   Down, Depressed, Hopeless 2 2 2   PHQ - 2 Score 5 5 5   Altered sleeping - - 3  Tired, decreased energy - - 3  Change in appetite - - 2  Feeling bad or failure about yourself  - - 3  Trouble concentrating - - 2  Moving slowly or fidgety/restless - - 0  Suicidal thoughts - - 1  PHQ-9 Score - - 19   Review of Systems  Constitutional: Negative.   HENT: Negative.   Eyes: Negative.   Respiratory: Negative.   Cardiovascular: Negative.   Gastrointestinal: Negative.   Endocrine: Negative.   Genitourinary: Negative.  Musculoskeletal: Positive for back pain, gait problem, neck pain and neck stiffness.  Skin: Negative.   Allergic/Immunologic: Negative.   Hematological: Negative.   Psychiatric/Behavioral: Negative.   All other systems reviewed and are negative.      Objective:   Physical Exam Gen: no distress, normal appearing HEENT: oral mucosa pink and moist, NCAT Cardio: Reg rate Chest: normal effort, normal rate of breathing Abd: soft, non-distended Ext: no edema Skin: intact Neuro: AOx3 Musculoskeletal:Diffuse weakness that is pain limited. Diffuse tender points in upper and lower body in both sides of the body. Prefers standing position to sitting position.Unable to sit for long without intense pain. Antalgic gait. Psych: pleasant, normal affect    Assessment & Plan:  History obtained from chart review and patient.  Jasmin [name] is a 74 year old woman with midline cystocele, fibromyalgia, uterovaginal prolapse, CAD, COPD, Dyslipidemia, GERD, palpitations, HTN, vitamin D deficiency, rheumatoid arthritis, who presents to establish care for chronic lumbosacral pain.  1) Failed back syndrome.  -Pain contract signedpreviously. -Urine samplereviewed previously and was negative. Routine urine screen repeated routinely and has been consistent. Hydrocodone #224 for 1 month was (80MED)  prescribed today. Patient will contact me when she needs refill after 1 month.Discussed that our goal is to wean patient off Norco long term as it is not ideal for chronic pain and she can develop dependence and/or opioid induced hyperanalgesia. Can potentially use steroids as an alternative as she had excellent response with this, but also don't want to expose her to the dangers of steroids when she is currently doing very well with Norco without side effects.   2) Mast cell cytosis -Provided referral to allergistbut she states this is too far for herand she has asked her PCP to provide referral to a closer allergist. -She has had course of steroids in the past that helped her.  -I think treatment of this syndrome could provide her improved quality of life.  -Reviewed her genetic analyses on 10/07/19. Last visit prescribe 65m prednisone 5 tablets for her to use when symptoms are particularly severe and to assess benefit for mast cell cytosis. She responded very well to this. Advised regarding risks of steroids. Can use in the future if pain worsens or to help wean off Norco.   3) Depression -She defers follow up with psychologist or medication treatment at this time. -Dicussed her sources of joy and encouraged her to spend more time with her great grandchildren when possible.  4) Impaired mobility and ADLs -She is able to ambulateless than 200 feet. Renewed handicap placardpreviously. 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   5) Given family history of connective tissue disease and her symptoms of CTD, have referred to rheumatology. She has appointment in September. She would be interested in starting Plaquenil. I have discussed the side effects of this medication with her and provided her with a script. Also prescribed diclofenac gel for her joint pain.   6) Advised that BP can be  elevated in response to stress. Advise that she take her BP at home daily and bring in log to PCP or our next appointment. She did  not bring in today but will next time.Renewed her Lisinopril 220mand Metoprolol 5037mn 10/07/19. Advised regarding high potassium foods such as bananas to help lower BP.   7) Bilateral knee pain: Prescribed Diclofenac gel.   8) Dry skin in groin  and under breasts: prescribed eucerin cream.  RTC in 2 months to assess progress with above interventions.

## 2020-01-22 ENCOUNTER — Ambulatory Visit: Payer: PPO | Admitting: Rheumatology

## 2020-02-25 ENCOUNTER — Other Ambulatory Visit: Payer: Self-pay | Admitting: Physical Medicine and Rehabilitation

## 2020-02-25 ENCOUNTER — Telehealth: Payer: Self-pay

## 2020-02-25 MED ORDER — HYDROCODONE-ACETAMINOPHEN 10-325 MG PO TABS: 2.0000 | ORAL_TABLET | Freq: Four times a day (QID) | ORAL | 0 refills | Status: DC | PRN

## 2020-02-25 NOTE — Telephone Encounter (Signed)
Sent!

## 2020-03-02 ENCOUNTER — Ambulatory Visit: Payer: PPO | Admitting: Rheumatology

## 2020-03-23 ENCOUNTER — Encounter: Payer: Self-pay | Admitting: Physical Medicine and Rehabilitation

## 2020-03-23 ENCOUNTER — Other Ambulatory Visit: Payer: Self-pay

## 2020-03-23 ENCOUNTER — Encounter: Payer: PPO | Attending: Physical Medicine and Rehabilitation | Admitting: Physical Medicine and Rehabilitation

## 2020-03-23 VITALS — BP 166/82 | HR 72 | Temp 98.3°F | Ht 60.0 in | Wt 155.0 lb

## 2020-03-23 DIAGNOSIS — M25562 Pain in left knee: Secondary | ICD-10-CM | POA: Diagnosis not present

## 2020-03-23 DIAGNOSIS — Z5181 Encounter for therapeutic drug level monitoring: Secondary | ICD-10-CM | POA: Diagnosis not present

## 2020-03-23 DIAGNOSIS — M25561 Pain in right knee: Secondary | ICD-10-CM | POA: Insufficient documentation

## 2020-03-23 DIAGNOSIS — M797 Fibromyalgia: Secondary | ICD-10-CM | POA: Diagnosis not present

## 2020-03-23 DIAGNOSIS — Z79891 Long term (current) use of opiate analgesic: Secondary | ICD-10-CM | POA: Diagnosis not present

## 2020-03-23 DIAGNOSIS — M545 Low back pain, unspecified: Secondary | ICD-10-CM | POA: Insufficient documentation

## 2020-03-23 DIAGNOSIS — M961 Postlaminectomy syndrome, not elsewhere classified: Secondary | ICD-10-CM | POA: Diagnosis not present

## 2020-03-23 DIAGNOSIS — G894 Chronic pain syndrome: Secondary | ICD-10-CM | POA: Diagnosis not present

## 2020-03-23 DIAGNOSIS — G8929 Other chronic pain: Secondary | ICD-10-CM | POA: Insufficient documentation

## 2020-03-23 DIAGNOSIS — D8940 Mast cell activation, unspecified: Secondary | ICD-10-CM | POA: Insufficient documentation

## 2020-03-23 MED ORDER — DICLOFENAC SODIUM 1 % EX GEL: 2.0000 g | Freq: Four times a day (QID) | CUTANEOUS | 3 refills | Status: DC

## 2020-03-23 MED ORDER — HYDROCODONE-ACETAMINOPHEN 10-325 MG PO TABS: 2.0000 | ORAL_TABLET | Freq: Four times a day (QID) | ORAL | 0 refills | Status: DC | PRN

## 2020-03-23 NOTE — Progress Notes (Signed)
Subjective:    Patient ID: Jasmin [name], female    DOB: 10/16/1945, 74 y.o.   MRN: [id number]  HPI  History obtained from chart review and patient.   Since last visit her pain has been stable.  She has been eating walnuts. She takes high dose magnesium supplement.  She always took care of her teeth because she was not absorbing nutrients. Her teeth break right off.   She would have pain from abscesse and she needed root canals. She is allergic to yogurt  She does not absorb salt well. She takes a tsp every night with a glass of water  Prior history:  Jasmin [name] is a 74 year old woman with midline cystocele, fibromyalgia, uterovaginal prolapse, CAD, COPD, Dyslipidemia, GERD, palpitations, HTN, vitamin D deficiency, rheumatoid arthritis, who presentsfor follow-up ofchronic lumbosacral pains/p failed back surgery.  Since last visit she has been having a lot of pain in her joints. She does not put any gels or creams on this.   She has not been able to see her rheumatologist in 3 years. She was trialed on Methotrexate in the past. She did benefit from low dose prednisone I prescribed last visit.   The Eucerin she has been using below her breasts has been very helpful.  We are prescribing Norco #224 pills for one month for her pain. She has been on this dose for many years and tolerates it well. The Norco 76m provides her 4 hours of relief and she takes8pills per day. Will continue this dose.   Sources of pain: neck, low back, shoulders.  Quality of pain: aching, stabbing, throbbing Duration of pain: constant Aggravating factors: bending, twisting, standing, sitting Associated symptoms: numbness, tingling, weakness, stiffness, decreased range of motion  Her pain is debilitating and has caused her to become depressed because of all of her limitations. Her sources of joy are watching TV, going on her ipad, and spending time with her children, grandchildren, and great  grandchildren.The Norco helps to control her pain.Her mood has been MUCH more positive since when I first met her.   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 She shares her genetic results with me. [place] but it was too far for her to keep going there. That is also why she switched pain providers-she loved her formed pain provider, JJeanella Anton but it was too far for her to go see her.Previously  [facility] to [facility].She has asked her PCP to refer her to a closer allergist. Previously I prescribed her 5 pills of prednisone 161mand she found incredible relief from this. She is wary of side effects of prednisone.   She feels she may have connective tissue disease as several of her family members have recently been diagnosed with this. She has cold hands, difficulty swallowing, diffuse pain, shortness of breath. Has not seen a rheumatologist in a long time.I have provided referral and she has an appointment to see one in September.   She has not been able to walk much due to the severity of her pain. She was able to get to today's appointment.   She also asks about COVID testing. She believes she got COVID early in the pandemic. She has not been vaccinated as is worried about how her body will respond to the vaccine.  Pain Inventory Average Pain 7 Pain Right Now 7 My pain is sharp, burning, dull, stabbing, tingling and aching  LOCATION OF PAIN  Neck, shoulder, wrist, hand, fingers, back, buttocks, hip, knee, leg, ankle, toes  BOWEL Number of stools per week:  Oral laxative use No  Type of laxative  Enema or suppository use No  History of colostomy No  Incontinent No   BLADDER Pads In and out cath, frequency  Able to self cath No  Bladder incontinence Yes  Frequent urination Yes   Leakage with coughing Yes  Difficulty starting stream Yes  Incomplete bladder emptying Yes    Mobility walk with assistance use a cane how many minutes can you walk? 5 ability to climb steps?  no do you drive?  yes  Function retired  Neuro/Psych trouble walking  Prior Studies Any changes since last visit?  no  Physicians involved in your care Any changes since last visit?  no   Family History  Problem Relation Age of Onset  . Coronary artery disease Father   . Coronary artery disease Mother   . Heart attack Brother   . Coronary artery disease Sister    Social History   Socioeconomic History  . Marital status: Married    Spouse name: Not on file  . Number of children: Not on file  . Years of education: Not on file  . Highest education level: Not on file  Occupational History  . Not on file  Tobacco Use  . Smoking status: Current Every Day Smoker    Types: E-cigarettes    Last attempt to quit: 01/13/2009    Years since quitting: 11.1  . Smokeless tobacco: Never Used  Substance and Sexual Activity  . Alcohol use: No  . Drug use: No  . Sexual activity: Not Currently  Other Topics Concern  . Not on file  Social History Narrative  . Not on file   Social Determinants of Health   Financial Resource Strain:   . Difficulty of Paying Living Expenses: Not on file  Food Insecurity:   . Worried About Charity fundraiser in the Last Year: Not on file  . Ran Out of Food in the Last Year: Not on file  Transportation Needs:   . Lack of Transportation (Medical): Not on file  . Lack of Transportation (Non-Medical): Not on file  Physical Activity:   . Days of Exercise per Week: Not on file  . Minutes of Exercise per Session: Not on file  Stress:   . Feeling of Stress : Not on file  Social Connections:   . Frequency of Communication with Friends and Family: Not on file  . Frequency of Social Gatherings with Friends and Family: Not on file  . Attends Religious  Services: Not on file  . Active Member of Clubs or Organizations: Not on file  . Attends Archivist Meetings: Not on file  . Marital Status: Not on file   Past Surgical History:  Procedure Laterality Date  . PTCA     Past Medical History:  Diagnosis Date  . CAD (coronary artery disease)   . COPD (chronic obstructive pulmonary disease) ([place])   . Dyslipidemia   . GERD (gastroesophageal reflux disease)   . HTN (hypertension)   . Palpitations    BP (!) 166/82   Pulse 72   Temp 98.3 F (36.8 C)   Ht 5' (1.524 m)   Wt 155 lb (70.3 kg)   SpO2 98%  BMI 30.27 kg/m   Opioid Risk Score:   Fall Risk Score:  `1  Depression screen PHQ 2/9  Depression screen [facility] 2/9 11/05/2019 10/07/2019 07/01/2019  Decreased Interest _0 Down, Depressed, Hopeless _1 PHQ - 2 Score _2 Altered sleeping - - 3  Tired, decreased energy - - 3  Change in appetite - - 2  Feeling bad or failure about yourself  - - 3  Trouble concentrating - - 2  Moving slowly or fidgety/restless - - 0  Suicidal thoughts - - 1  PHQ-9 Score - - 19    Family History  Problem Relation Age of Onset  . Coronary artery disease Father   . Coronary artery disease Mother   . Heart attack Brother   . Coronary artery disease Sister    Social History   Socioeconomic History  . Marital status: Married    Spouse name: Not on file  . Number of children: Not on file  . Years of education: Not on file  . Highest education level: Not on file  Occupational History  . Not on file  Tobacco Use  . Smoking status: Current Every Day Smoker    Types: E-cigarettes    Last attempt to quit: 01/13/2009    Years since quitting: 11.1  . Smokeless tobacco: Never Used  Substance and Sexual Activity  . Alcohol use: No  . Drug use: No  . Sexual activity: Not Currently  Other Topics Concern  . Not on file  Social History Narrative  . Not on file   Social Determinants of Health   Financial Resource Strain:   .  Difficulty of Paying Living Expenses: Not on file  Food Insecurity:   . Worried About Charity fundraiser in the Last Year: Not on file  . Ran Out of Food in the Last Year: Not on file  Transportation Needs:   . Lack of Transportation (Medical): Not on file  . Lack of Transportation (Non-Medical): Not on file  Physical Activity:   . Days of Exercise per Week: Not on file  . Minutes of Exercise per Session: Not on file  Stress:   . Feeling of Stress : Not on file  Social Connections:   . Frequency of Communication with Friends and Family: Not on file  . Frequency of Social Gatherings with Friends and Family: Not on file  . Attends Religious Services: Not on file  . Active Member of Clubs or Organizations: Not on file  . Attends Archivist Meetings: Not on file  . Marital Status: Not on file   Past Surgical History:  Procedure Laterality Date  . PTCA     Past Medical History:  Diagnosis Date  . CAD (coronary artery disease)   . COPD (chronic obstructive pulmonary disease) ([place])   . Dyslipidemia   . GERD (gastroesophageal reflux disease)   . HTN (hypertension)   . Palpitations    There were no vitals taken for this visit.  Opioid Risk Score:   Fall Risk Score:  `1  Depression screen PHQ 2/9  Depression screen [facility] 2/9 11/05/2019 10/07/2019 07/01/2019  Decreased Interest _3 Down, Depressed, Hopeless _4 PHQ - 2 Score _5 Altered sleeping - - 3  Tired, decreased energy - - 3  Change in appetite - - 2  Feeling bad or failure about yourself  - - 3  Trouble concentrating - -  2  Moving slowly or fidgety/restless - - 0  Suicidal thoughts - - 1  PHQ-9 Score - - 19   Review of Systems  Constitutional: Negative.   HENT: Negative.   Eyes: Negative.   Respiratory: Negative.   Cardiovascular: Negative.   Gastrointestinal: Negative.   Endocrine: Negative.   Genitourinary: Negative.   Musculoskeletal: Positive for back pain, gait problem, neck pain and neck  stiffness.  Skin: Negative.   Allergic/Immunologic: Negative.   Hematological: Negative.   Psychiatric/Behavioral: Negative.   All other systems reviewed and are negative.      Objective:   Physical Exam Gen: no distress, normal appearing HEENT: oral mucosa pink and moist, NCAT Cardio: Reg rate Chest: normal effort, normal rate of breathing Abd: soft, non-distended Ext: no edema Skin: intact NNeuro: AOx3 Musculoskeletal:Diffuse weakness that is pain limited. Diffuse tender points in upper and lower body in both sides of the body. Prefers standing position to sitting position.Unable to sit for long without intense pain. Antalgic gait. Psych: pleasant, normal affect    Assessment & Plan:  History obtained from chart review and patient.  Jasmin [name] is a 74 year old woman with midline cystocele, fibromyalgia, uterovaginal prolapse, CAD, COPD, Dyslipidemia, GERD, palpitations, HTN, vitamin D deficiency, rheumatoid arthritis, who presents to establish care for chronic lumbosacral pain.  1) Failed back syndrome.  -Pain contract signedpreviously. -Urine samplereviewed previously and was negative. Routine urine screen repeated routinely and has been consistent. Hydrocodone #224 for 1 month was (80MED) prescribed today. Patient will contact me when she needs refill after 1 month.Discussed that our goal is to wean patient off Norco long term as it is not ideal for chronic pain and she can develop dependence and/or opioid induced hyperanalgesia. Can potentially use steroids as an alternative as she had excellent response with this, but also don't want to expose her to the dangers of steroids when she is currently doing very well with Norco without side effects.  -Refilled voltaren gel.  -Uses can as needed for long distances -She has handicap placard.   2) Mast cell cytosis -Provided referral to allergistbut she states this is too far for herand she has asked her PCP to provide  referral to a closer allergist. -She has had course of steroids in the past that helped her.  -I think treatment of this syndrome could provide her improved quality of life.  -Reviewed her genetic analyses on 10/07/19. Last visit prescribe 85m prednisone 5 tablets for her to use when symptoms are particularly severe and to assess benefit for mast cell cytosis. She responded very well to this. Advised regarding risks of steroids. Can use in the future if pain worsens or to help wean off Norco.   3) Depression -She defers follow up with psychologist or medication treatment at this time. -Dicussed her sources of joy and encouraged her to spend more time with her great grandchildren when possible. -Stable.   4) Impaired mobility and ADLs -She is able to ambulateless than 200 feet. Renewed handicap placardpreviously. 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   5) Given family history of connective tissue disease and her symptoms of CTD, have referred to rheumatology. She has appointment in September. She would be interested in starting Plaquenil. I have discussed the side effects of this medication with her and provided her with a script. Also prescribed diclofenac gel for her joint pain.  6) HTN: Advised that BP can be elevated in response to stress. Advise that she take her BP at home daily and bring in log to PCP or our next appointment. She did  not bring in today but will next time.166/82 today, but has been stable at home. Renewed her Lisinopril 31m and Metoprolol 569mon 10/07/19. Advised regarding high potassium foods such as bananas to help lower BP.   7) Bilateral knee pain: Prescribed Diclofenac gel.   8) Dry skin in groin and under breasts: prescribed eucerin cream.  RTC in 2 months to assess progress with above interventions.

## 2020-03-23 NOTE — Patient Instructions (Signed)
The [employer] Diet

## 2020-03-26 ENCOUNTER — Telehealth: Payer: Self-pay | Admitting: *Deleted

## 2020-03-26 LAB — TOXASSURE SELECT,+ANTIDEPR,UR

## 2020-03-26 NOTE — Telephone Encounter (Signed)
Urine drug screen for this encounter is consistent for prescribed medication 

## 2020-04-21 ENCOUNTER — Telehealth: Payer: Self-pay

## 2020-04-21 ENCOUNTER — Other Ambulatory Visit: Payer: Self-pay | Admitting: Physical Medicine and Rehabilitation

## 2020-04-21 MED ORDER — HYDROCODONE-ACETAMINOPHEN 10-325 MG PO TABS: 2.0000 | ORAL_TABLET | Freq: Four times a day (QID) | ORAL | 0 refills | Status: DC | PRN

## 2020-04-21 MED ORDER — PREDNISONE 1 MG PO TABS
1.0000 mg | ORAL_TABLET | Freq: Every day | ORAL | 0 refills | Status: DC | PRN
Start: 2020-04-21 — End: 2020-09-03

## 2020-04-21 NOTE — Telephone Encounter (Signed)
Refill request for Hydrocodone and Prednisone. Next appt 05/18/20

## 2020-04-21 NOTE — Telephone Encounter (Signed)
I have sent both!

## 2020-04-22 NOTE — Telephone Encounter (Signed)
Called to inform patient, no answer.

## 2020-05-17 ENCOUNTER — Other Ambulatory Visit: Payer: Self-pay

## 2020-05-17 MED ORDER — HYDROCODONE-ACETAMINOPHEN 10-325 MG PO TABS: 2.0000 | ORAL_TABLET | Freq: Four times a day (QID) | ORAL | 0 refills | Status: DC | PRN

## 2020-05-18 ENCOUNTER — Encounter: Payer: PPO | Admitting: Physical Medicine and Rehabilitation

## 2020-06-10 ENCOUNTER — Telehealth: Payer: Self-pay

## 2020-06-10 NOTE — Telephone Encounter (Signed)
Raquel from [employer] called regarding patient medication - no indication of which just return phone number [phone] please reference #[id number]

## 2020-06-14 ENCOUNTER — Other Ambulatory Visit: Payer: Self-pay | Admitting: Physical Medicine and Rehabilitation

## 2020-06-14 ENCOUNTER — Telehealth: Payer: Self-pay

## 2020-06-14 MED ORDER — HYDROCODONE-ACETAMINOPHEN 10-325 MG PO TABS: 2.0000 | ORAL_TABLET | Freq: Four times a day (QID) | ORAL | 0 refills | Status: DC | PRN

## 2020-06-14 NOTE — Telephone Encounter (Signed)
Sent!

## 2020-06-14 NOTE — Telephone Encounter (Signed)
Patient called to say needs her hydrocodone 10/325   Refill her number [phone]

## 2020-06-25 ENCOUNTER — Encounter: Payer: PPO | Attending: Physical Medicine and Rehabilitation | Admitting: Physical Medicine and Rehabilitation

## 2020-06-25 ENCOUNTER — Encounter: Payer: Self-pay | Admitting: Physical Medicine and Rehabilitation

## 2020-06-25 ENCOUNTER — Other Ambulatory Visit: Payer: Self-pay

## 2020-06-25 VITALS — BP 169/80 | HR 73 | Temp 98.4°F | Wt 154.0 lb

## 2020-06-25 DIAGNOSIS — D8940 Mast cell activation, unspecified: Secondary | ICD-10-CM | POA: Insufficient documentation

## 2020-06-25 DIAGNOSIS — I10 Essential (primary) hypertension: Secondary | ICD-10-CM | POA: Diagnosis not present

## 2020-06-25 DIAGNOSIS — G894 Chronic pain syndrome: Secondary | ICD-10-CM | POA: Diagnosis not present

## 2020-06-25 DIAGNOSIS — M961 Postlaminectomy syndrome, not elsewhere classified: Secondary | ICD-10-CM | POA: Insufficient documentation

## 2020-06-25 DIAGNOSIS — G8929 Other chronic pain: Secondary | ICD-10-CM | POA: Diagnosis not present

## 2020-06-25 DIAGNOSIS — M797 Fibromyalgia: Secondary | ICD-10-CM | POA: Diagnosis not present

## 2020-06-25 DIAGNOSIS — M25561 Pain in right knee: Secondary | ICD-10-CM | POA: Diagnosis not present

## 2020-06-25 DIAGNOSIS — M25562 Pain in left knee: Secondary | ICD-10-CM | POA: Insufficient documentation

## 2020-06-25 MED ORDER — HYDROCODONE-ACETAMINOPHEN 10-325 MG PO TABS: 2.0000 | ORAL_TABLET | Freq: Four times a day (QID) | ORAL | 0 refills | Status: DC | PRN

## 2020-06-25 MED ORDER — LISINOPRIL 40 MG PO TABS: 40.0000 mg | ORAL_TABLET | Freq: Every day | ORAL | 1 refills | Status: DC

## 2020-06-25 MED ORDER — METOPROLOL SUCCINATE ER 50 MG PO TB24
50.0000 mg | ORAL_TABLET | Freq: Every day | ORAL | 1 refills | Status: DC
Start: 2020-06-25 — End: 2021-01-28

## 2020-06-25 MED ORDER — DOXYCYCLINE MONOHYDRATE 100 MG PO CAPS: 100.0000 mg | ORAL_CAPSULE | Freq: Two times a day (BID) | ORAL | 1 refills | Status: DC
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
## 2020-06-25 NOTE — Patient Instructions (Signed)
Foods that lower blood pressure: 1) citrus foods 2) salmon and other fatty fish 3) swiss chard (leafy green) 4) pumpkin seeds 5) Beans and lentils 6) Berries 7) Amaranth (whole grain, can be cooked similarly to rice and oats) 8) Pistachios 9) Carrots 10) Celery 11) Tomatoes 12) Broccoli 13) Greek yogurt 14) Herbs and spices: Celery seed, cilantro, saffron, lemongrass, black cumin, ginseng, cinnamon, cardamom, sweet basil, and ginger 15) Chia and flax seeds 16) Beets 17) spinach

## 2020-07-19 ENCOUNTER — Other Ambulatory Visit: Payer: Self-pay | Admitting: Physical Medicine and Rehabilitation

## 2020-08-06 ENCOUNTER — Other Ambulatory Visit: Payer: Self-pay

## 2020-08-06 MED ORDER — HYDROCODONE-ACETAMINOPHEN 10-325 MG PO TABS: 2.0000 | ORAL_TABLET | Freq: Four times a day (QID) | ORAL | 0 refills | Status: DC | PRN

## 2020-08-16 ENCOUNTER — Other Ambulatory Visit: Payer: Self-pay | Admitting: Physical Medicine and Rehabilitation

## 2020-08-24 ENCOUNTER — Encounter: Payer: PPO | Admitting: Physical Medicine and Rehabilitation

## 2020-09-03 ENCOUNTER — Telehealth: Payer: Self-pay

## 2020-09-03 ENCOUNTER — Other Ambulatory Visit: Payer: Self-pay | Admitting: Physical Medicine and Rehabilitation

## 2020-09-03 MED ORDER — PREDNISONE 1 MG PO TABS: 1.0000 mg | ORAL_TABLET | Freq: Every day | ORAL | 0 refills | Status: DC | PRN

## 2020-09-03 MED ORDER — HYDROCODONE-ACETAMINOPHEN 10-325 MG PO TABS: 2.0000 | ORAL_TABLET | Freq: Four times a day (QID) | ORAL | 0 refills | Status: DC | PRN

## 2020-09-07 NOTE — Telephone Encounter (Signed)
Task completed

## 2020-09-21 ENCOUNTER — Other Ambulatory Visit: Payer: Self-pay

## 2020-09-21 MED ORDER — LIOTHYRONINE SODIUM 5 MCG PO TABS: ORAL_TABLET | ORAL | 3 refills | Status: DC

## 2020-09-21 NOTE — Telephone Encounter (Signed)
Pharmacy requests refill on: Liothyronine Sodium 5 mcg   LAST REFILL: 10/01/2019 (Q-180, R-3) LAST OV: 08/25/2019 NEXT OV: Not Scheduled  PHARMACY: [id number] [place], [place]  TSH (06/08/2008)
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
## 2020-10-01 ENCOUNTER — Other Ambulatory Visit: Payer: Self-pay

## 2020-10-04 MED ORDER — LIOTHYRONINE SODIUM 5 MCG PO TABS: ORAL_TABLET | ORAL | 3 refills | Status: DC

## 2020-10-04 MED ORDER — HYDROCODONE-ACETAMINOPHEN 10-325 MG PO TABS: 2.0000 | ORAL_TABLET | Freq: Four times a day (QID) | ORAL | 0 refills | Status: DC | PRN

## 2020-10-08 ENCOUNTER — Encounter: Payer: PPO | Admitting: Physical Medicine and Rehabilitation

## 2020-11-02 ENCOUNTER — Telehealth: Payer: Self-pay

## 2020-11-02 NOTE — Telephone Encounter (Signed)
Patient called requesting refill on Hydrocodone. Last filled #[id number] on 10/04/20 next appt 01/14/21

## 2020-11-04 ENCOUNTER — Other Ambulatory Visit: Payer: Self-pay

## 2020-11-04 ENCOUNTER — Other Ambulatory Visit: Payer: Self-pay | Admitting: *Deleted

## 2020-11-04 ENCOUNTER — Encounter: Payer: PPO | Attending: Registered Nurse | Admitting: Physical Medicine and Rehabilitation

## 2020-11-04 DIAGNOSIS — M961 Postlaminectomy syndrome, not elsewhere classified: Secondary | ICD-10-CM | POA: Diagnosis not present

## 2020-11-04 DIAGNOSIS — Z79891 Long term (current) use of opiate analgesic: Secondary | ICD-10-CM | POA: Diagnosis not present

## 2020-11-04 DIAGNOSIS — G894 Chronic pain syndrome: Secondary | ICD-10-CM

## 2020-11-04 DIAGNOSIS — Z5181 Encounter for therapeutic drug level monitoring: Secondary | ICD-10-CM | POA: Diagnosis not present

## 2020-11-04 DIAGNOSIS — R519 Headache, unspecified: Secondary | ICD-10-CM

## 2020-11-04 MED ORDER — TOPIRAMATE 25 MG PO TABS: 25.0000 mg | ORAL_TABLET | Freq: Every day | ORAL | 0 refills | Status: DC

## 2020-11-04 MED ORDER — HYDROCODONE-ACETAMINOPHEN 10-325 MG PO TABS: 2.0000 | ORAL_TABLET | Freq: Four times a day (QID) | ORAL | 0 refills | Status: DC | PRN
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
## 2020-11-05 ENCOUNTER — Telehealth: Payer: Self-pay | Admitting: *Deleted

## 2020-11-05 MED ORDER — HYDROCODONE-ACETAMINOPHEN 10-325 MG PO TABS: 2.0000 | ORAL_TABLET | Freq: Four times a day (QID) | ORAL | 0 refills | Status: DC | PRN

## 2020-11-05 NOTE — Telephone Encounter (Signed)
Hydrocodone 10 325 #240 on back [facility].  Found at [employer] [facility].  Please send to that location.

## 2020-11-05 NOTE — Telephone Encounter (Signed)
PMP was Reviewed: Hydrocodone e- scribed today. Sybil RN spoke to Ms. Wacha regarding the above.

## 2020-11-09 ENCOUNTER — Other Ambulatory Visit: Payer: Self-pay | Admitting: Physical Medicine and Rehabilitation

## 2020-11-12 LAB — TOXASSURE SELECT,+ANTIDEPR,UR

## 2020-11-18 ENCOUNTER — Telehealth: Payer: Self-pay | Admitting: *Deleted

## 2020-11-18 NOTE — Telephone Encounter (Signed)
Urine drug screen for this encounter is consistent for prescribed medication 

## 2020-11-27 ENCOUNTER — Other Ambulatory Visit: Payer: Self-pay | Admitting: Physical Medicine and Rehabilitation

## 2020-12-03 ENCOUNTER — Telehealth: Payer: Self-pay | Admitting: *Deleted

## 2020-12-03 MED ORDER — HYDROCODONE-ACETAMINOPHEN 10-325 MG PO TABS: 2.0000 | ORAL_TABLET | Freq: Four times a day (QID) | ORAL | 0 refills | Status: DC | PRN

## 2020-12-03 NOTE — Telephone Encounter (Signed)
Jasmin [name] called for a refill on her hydrocodone.  Last fill date was 11/05/20 and next appt is 01/03/21.

## 2020-12-03 NOTE — Telephone Encounter (Signed)
PMP was Reviewed: Hydrocodone e-scribed today. Placed a call to Ms. Suplee regarding the above, no answer. Left message to return the call. She needs to have her appointment change to a week earlier, she will run out of medication with her current scheduled appointment date.

## 2020-12-12 ENCOUNTER — Other Ambulatory Visit: Payer: Self-pay | Admitting: Physical Medicine and Rehabilitation

## 2020-12-24 ENCOUNTER — Encounter: Payer: Self-pay | Admitting: Registered Nurse

## 2020-12-24 ENCOUNTER — Other Ambulatory Visit: Payer: Self-pay

## 2020-12-24 ENCOUNTER — Encounter: Payer: PPO | Attending: Registered Nurse | Admitting: Registered Nurse

## 2020-12-24 VITALS — Ht 60.0 in | Wt 154.0 lb

## 2020-12-24 DIAGNOSIS — Z79891 Long term (current) use of opiate analgesic: Secondary | ICD-10-CM | POA: Insufficient documentation

## 2020-12-24 DIAGNOSIS — Z5181 Encounter for therapeutic drug level monitoring: Secondary | ICD-10-CM | POA: Diagnosis not present

## 2020-12-24 DIAGNOSIS — M797 Fibromyalgia: Secondary | ICD-10-CM | POA: Insufficient documentation

## 2020-12-24 DIAGNOSIS — G894 Chronic pain syndrome: Secondary | ICD-10-CM | POA: Diagnosis not present

## 2020-12-24 DIAGNOSIS — M961 Postlaminectomy syndrome, not elsewhere classified: Secondary | ICD-10-CM | POA: Insufficient documentation

## 2020-12-24 MED ORDER — HYDROCODONE-ACETAMINOPHEN 10-325 MG PO TABS: 2.0000 | ORAL_TABLET | Freq: Four times a day (QID) | ORAL | 0 refills | Status: DC | PRN

## 2020-12-24 NOTE — Progress Notes (Addendum)
Subjective:    Patient ID: Jasmin [name], female    DOB: 29-Sep-1945, 75 y.o.   MRN: [id number]  HPI: Jasmin [name] is a 75 y.o. female whose scheduled for a telephone visit today, Ms. Beine agrees with telephone visit, she verbalizes understanding. She states her pain is located in her lower back and reports generalized pain all over. She rates her pain 10. Her current exercise regime is walking in her home with a cane she reports  Ms. Hegeman Morphine equivalent is 80.00 MME.   Last UDS was Performed on 11/04/2020, it was consistent.   Pain Inventory Average Pain 10 Pain Right Now 10 My pain is constant  In the last 24 hours, has pain interfered with the following? General activity 8 Relation with others 8 Enjoyment of life 8 What TIME of day is your pain at its worst? morning , daytime, evening, and night Sleep (in general) Poor  Pain is worse with: walking and some activites Pain improves with: medication Relief from Meds: 6  Family History  Problem Relation Age of Onset   Coronary artery disease Father    Coronary artery disease Mother    Heart attack Brother    Coronary artery disease Sister    Social History   Socioeconomic History   Marital status: Married    Spouse name: Not on file   Number of children: Not on file   Years of education: Not on file   Highest education level: Not on file  Occupational History   Not on file  Tobacco Use   Smoking status: Every Day    Types: E-cigarettes    Last attempt to quit: 01/13/2009    Years since quitting: 11.9   Smokeless tobacco: Never  Substance and Sexual Activity   Alcohol use: No   Drug use: No   Sexual activity: Not Currently  Other Topics Concern   Not on file  Social History Narrative   Not on file   Social Determinants of Health   Financial Resource Strain: Not on file  Food Insecurity: Not on file  Transportation Needs: Not on file  Physical Activity: Not on file  Stress: Not on file  Social  Connections: Not on file   Past Surgical History:  Procedure Laterality Date   PTCA     Past Surgical History:  Procedure Laterality Date   PTCA     Past Medical History:  Diagnosis Date   CAD (coronary artery disease)    COPD (chronic obstructive pulmonary disease) (HCC)    Dyslipidemia    GERD (gastroesophageal reflux disease)    HTN (hypertension)    Palpitations    Ht 5' (1.524 m)   Wt 154 lb (69.9 kg)   BMI 30.08 kg/m   Opioid Risk Score:   Fall Risk Score:  `1  Depression screen PHQ 2/9  Depression screen [facility] 2/9 11/05/2019 10/07/2019 07/01/2019  Decreased Interest '3 3 3  '$ Down, Depressed, Hopeless '2 2 2  '$ PHQ - 2 Score '5 5 5  '$ Altered sleeping - - 3  Tired, decreased energy - - 3  Change in appetite - - 2  Feeling bad or failure about yourself  - - 3  Trouble concentrating - - 2  Moving slowly or fidgety/restless - - 0  Suicidal thoughts - - 1  PHQ-9 Score - - 19       Review of Systems  Constitutional: Negative.   HENT: Negative.    Eyes: Negative.   Respiratory:  Negative.    Cardiovascular: Negative.   Gastrointestinal: Negative.   Endocrine: Negative.   Genitourinary: Negative.   Musculoskeletal:  Positive for back pain, gait problem and neck pain.       Pt reports pain allover body , incluiding pain in both legs, joint pain  Skin: Negative.   Allergic/Immunologic: Negative.   Hematological: Negative.   Psychiatric/Behavioral: Negative.        Objective:   Physical Exam Vitals and nursing note reviewed.  Musculoskeletal:     Comments: No Physical Exam Perform: Telephone visit         Assessment & Plan:  Failed Back Syndrome: Continue HEP as Tolerated. Continue to Monitor.  Fibromyalgia: Continue HEP as Tolerated. Continue current medication regimen and we will continue to monitor.  Chronic Pain Syndrome: Refilled Hydrocodone 10 /325 take two tablets every 6 hours as needed for pain #240. We will continue the opioid monitoring program,  this consists of regular clinic visits, examinations, urine drug screen, pill counts as well as use of [place] Controlled Substance Reporting system. A 12 month History has been reviewed on the [place] [facility].    F/U in 1 month   Telephone Visit Established Patient Location of Patient : In her Home Location of Provider: In the Office  Time Spent: 10 Minutes

## 2021-01-03 ENCOUNTER — Telehealth: Payer: PPO | Admitting: Registered Nurse

## 2021-01-03 ENCOUNTER — Telehealth: Payer: Self-pay

## 2021-01-03 NOTE — Telephone Encounter (Signed)
Refill request for Lisinopril. Patient does have a PCP. [facility]

## 2021-01-03 NOTE — Addendum Note (Signed)
Addended by: Bayard Hugger on: 01/03/2021 11:03 AM   Modules accepted: Level of Service

## 2021-01-04 MED ORDER — LISINOPRIL 40 MG PO TABS
40.0000 mg | ORAL_TABLET | Freq: Every day | ORAL | 1 refills | Status: DC
Start: 2021-01-04 — End: 2021-01-28

## 2021-01-04 NOTE — Telephone Encounter (Signed)
done

## 2021-01-14 ENCOUNTER — Ambulatory Visit: Payer: PPO | Admitting: Physical Medicine and Rehabilitation

## 2021-01-28 ENCOUNTER — Telehealth: Payer: Self-pay | Admitting: Registered Nurse

## 2021-01-28 ENCOUNTER — Other Ambulatory Visit: Payer: Self-pay

## 2021-01-28 ENCOUNTER — Other Ambulatory Visit: Payer: Self-pay | Admitting: Physical Medicine and Rehabilitation

## 2021-01-28 ENCOUNTER — Encounter: Payer: PPO | Attending: Registered Nurse | Admitting: Registered Nurse

## 2021-01-28 ENCOUNTER — Encounter: Payer: Self-pay | Admitting: Registered Nurse

## 2021-01-28 VITALS — BP 169/50 | Ht 60.0 in | Wt 154.0 lb

## 2021-01-28 DIAGNOSIS — M797 Fibromyalgia: Secondary | ICD-10-CM | POA: Diagnosis not present

## 2021-01-28 DIAGNOSIS — Z79891 Long term (current) use of opiate analgesic: Secondary | ICD-10-CM | POA: Insufficient documentation

## 2021-01-28 DIAGNOSIS — G894 Chronic pain syndrome: Secondary | ICD-10-CM | POA: Insufficient documentation

## 2021-01-28 DIAGNOSIS — M961 Postlaminectomy syndrome, not elsewhere classified: Secondary | ICD-10-CM | POA: Insufficient documentation

## 2021-01-28 DIAGNOSIS — Z5181 Encounter for therapeutic drug level monitoring: Secondary | ICD-10-CM | POA: Diagnosis not present

## 2021-01-28 DIAGNOSIS — M545 Low back pain, unspecified: Secondary | ICD-10-CM | POA: Insufficient documentation

## 2021-01-28 DIAGNOSIS — G8929 Other chronic pain: Secondary | ICD-10-CM | POA: Insufficient documentation

## 2021-01-28 MED ORDER — LISINOPRIL 40 MG PO TABS: 40.0000 mg | ORAL_TABLET | Freq: Every day | ORAL | 1 refills | Status: DC

## 2021-01-28 MED ORDER — HYDROCODONE-ACETAMINOPHEN 10-325 MG PO TABS: 2.0000 | ORAL_TABLET | Freq: Four times a day (QID) | ORAL | 0 refills | Status: DC | PRN

## 2021-01-28 MED ORDER — METOPROLOL SUCCINATE ER 50 MG PO TB24: 50.0000 mg | ORAL_TABLET | Freq: Every day | ORAL | 1 refills | Status: DC

## 2021-01-28 MED ORDER — METHYLPREDNISOLONE 4 MG PO TBPK: ORAL_TABLET | ORAL | 0 refills | Status: DC

## 2021-01-28 NOTE — Telephone Encounter (Signed)
Dr Ranell Patrick,  Ms. Connley needs her Metoprolol and Lisinopril, she states you have been refilling her medication for over a year.

## 2021-01-28 NOTE — Progress Notes (Signed)
Subjective:    Patient ID: AZIYAH METOXEN, female    DOB: October 21, 1945, 75 y.o.   MRN: [id number]  HPI: KETZIA FRIEDE is a 75 y.o. female who is scheduled for a Telephone Visit today, she agrees with Telephone visit and she verbalizes understanding.  She states her pain is located in her lower back pain radiating into her right lower extremity.Also reports increase intensity of lower back pain , we will order a medrol dose pak, she verbalizes understanding. She rates her pain 10. Her current exercise regime is walking short distances.  Ms. Elmquist Morphine equivalent is 80.00 MME.   Last UDS was Performed on 11/04/2020, it was consistent.     Pain Inventory Average Pain 6 Pain Right Now 10 My pain is constant, sharp, burning, dull, stabbing, tingling, and aching  In the last 24 hours, has pain interfered with the following? General activity 10 Relation with others 0 Enjoyment of life 10 What TIME of day is your pain at its worst? morning , daytime, evening, and night Sleep (in general) Poor  Pain is worse with: walking, bending, sitting, inactivity, standing, and some activites Pain improves with: rest and medication Relief from Meds: 6  Family History  Problem Relation Age of Onset  . Coronary artery disease Father   . Coronary artery disease Mother   . Heart attack Brother   . Coronary artery disease Sister    Social History   Socioeconomic History  . Marital status: Married    Spouse name: Not on file  . Number of children: Not on file  . Years of education: Not on file  . Highest education level: Not on file  Occupational History  . Not on file  Tobacco Use  . Smoking status: Every Day    Types: E-cigarettes    Last attempt to quit: 01/13/2009    Years since quitting: 12.0  . Smokeless tobacco: Never  Substance and Sexual Activity  . Alcohol use: No  . Drug use: No  . Sexual activity: Not Currently  Other Topics Concern  . Not on file  Social History  Narrative  . Not on file   Social Determinants of Health   Financial Resource Strain: Not on file  Food Insecurity: Not on file  Transportation Needs: Not on file  Physical Activity: Not on file  Stress: Not on file  Social Connections: Not on file   Past Surgical History:  Procedure Laterality Date  . PTCA     Past Surgical History:  Procedure Laterality Date  . PTCA     Past Medical History:  Diagnosis Date  . CAD (coronary artery disease)   . COPD (chronic obstructive pulmonary disease) ([place])   . Dyslipidemia   . GERD (gastroesophageal reflux disease)   . HTN (hypertension)   . Palpitations    There were no vitals taken for this visit.  Opioid Risk Score:   Fall Risk Score:  `1  Depression screen PHQ 2/9  Depression screen [facility] 2/9 12/24/2020 11/05/2019 10/07/2019 07/01/2019  Decreased Interest '1 3 3 3  '$ Down, Depressed, Hopeless '1 2 2 2  '$ PHQ - 2 Score '2 5 5 5  '$ Altered sleeping - - - 3  Tired, decreased energy - - - 3  Change in appetite - - - 2  Feeling bad or failure about yourself  - - - 3  Trouble concentrating - - - 2  Moving slowly or fidgety/restless - - - 0  Suicidal thoughts - - -  1  PHQ-9 Score - - - 19    Review of Systems  Musculoskeletal:  Positive for arthralgias, back pain, gait problem and neck pain.       Pain in all joints  All other systems reviewed and are negative.     Objective:   Physical Exam Vitals and nursing note reviewed.  Musculoskeletal:     Comments: No Physical Exam Performed: My-Chart Video Visit         Assessment & Plan:  Failed Back Syndrome: Continue HEP as Tolerated. Continue to Monitor. 01/28/2021 Fibromyalgia: Continue HEP as Tolerated. Continue current medication regimen and we will continue to monitor. 01/28/2021 Chronic Pain Syndrome: Refilled Hydrocodone 10 /325 take two tablets every 6 hours as needed for pain #240. We will continue the opioid monitoring program, this consists of regular clinic visits,  examinations, urine drug screen, pill counts as well as use of [place] Controlled Substance Reporting system. A 12 month History has been reviewed on the [place] Controlled Substance Reporting System 01/28/2021     F/U in 1 month    Telephone Visit Established Patient Location of Patient : In her Home Location of Provider: In the Office  Time Spent: 10 Minutes
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
## 2021-02-25 ENCOUNTER — Other Ambulatory Visit: Payer: Self-pay

## 2021-02-25 ENCOUNTER — Encounter: Payer: Self-pay | Admitting: Physical Medicine and Rehabilitation

## 2021-02-25 ENCOUNTER — Encounter: Payer: PPO | Attending: Registered Nurse | Admitting: Physical Medicine and Rehabilitation

## 2021-02-25 VITALS — BP 167/86 | HR 70 | Temp 98.6°F | Ht 60.0 in | Wt 150.2 lb

## 2021-02-25 DIAGNOSIS — Z79891 Long term (current) use of opiate analgesic: Secondary | ICD-10-CM | POA: Diagnosis not present

## 2021-02-25 DIAGNOSIS — G894 Chronic pain syndrome: Secondary | ICD-10-CM | POA: Diagnosis not present

## 2021-02-25 DIAGNOSIS — Z5181 Encounter for therapeutic drug level monitoring: Secondary | ICD-10-CM | POA: Insufficient documentation

## 2021-02-25 MED ORDER — PREDNISONE 1 MG PO TABS: 1.0000 mg | ORAL_TABLET | Freq: Two times a day (BID) | ORAL | 3 refills | Status: DC | PRN

## 2021-02-25 MED ORDER — HYDROCODONE-ACETAMINOPHEN 10-325 MG PO TABS: 2.0000 | ORAL_TABLET | Freq: Four times a day (QID) | ORAL | 0 refills | Status: DC | PRN

## 2021-02-25 MED ORDER — CLONIDINE 0.1 MG/24HR TD PTWK: 0.1000 mg | MEDICATED_PATCH | TRANSDERMAL | 12 refills | Status: DC

## 2021-02-25 NOTE — Patient Instructions (Signed)
HTN: -BP is 167/86 today.  -Advised regarding healthy foods that can help lower blood pressure and provided with a list: 1) citrus foods- high in vitamins and minerals 2) salmon and other fatty fish - reduces inflammation and oxylipins 3) swiss chard (leafy green)- high level of nitrates 4) pumpkin seeds- one of the best natural sources of magnesium 5) Beans and lentils- high in fiber, magnesium, and potassium 6) Berries- high in flavonoids 7) Amaranth (whole grain, can be cooked similarly to rice and oats)- high in magnesium and fiber 8) Pistachios- even more effective at reducing BP than other nuts 9) Carrots- high in phenolic compounds that relax blood vessels and reduce inflammation 10) Celery- contain phthalides that relax tissues of arterial walls 11) Tomatoes- can also improve cholesterol and reduce risk of heart disease 12) Broccoli- good source of magnesium, calcium, and potassium 13) Greek yogurt: high in potassium and calcium 14) Herbs and spices: Celery seed, cilantro, saffron, lemongrass, black cumin, ginseng, cinnamon, cardamom, sweet basil, and ginger 15) Chia and flax seeds- also help to lower cholesterol and blood sugar 16) Beets- high levels of nitrates that relax blood vessels  17) spinach and bananas- high in potassium  -Provided lise of supplements that can help with hypertension:  1) magnesium: one high quality brand is Bioptemizers since it contains all 7 types of magnesium, otherwise over the counter magnesium gluconate 400mg  is a good option 2) B vitamins 3) vitamin D 4) potassium 5) CoQ10 6) L-arginine 7) Vitamin C 8) Beetroot -Educated that goal BP is 120/80. -Made goal to incorporate some of the above foods into diet.

## 2021-02-28 ENCOUNTER — Telehealth: Payer: Self-pay

## 2021-02-28 MED ORDER — LISINOPRIL 40 MG PO TABS
40.0000 mg | ORAL_TABLET | Freq: Every day | ORAL | 1 refills | Status: DC
Start: 2021-02-28 — End: 2021-03-07

## 2021-02-28 NOTE — Telephone Encounter (Signed)
Refill request for Lisinopril

## 2021-03-02 ENCOUNTER — Telehealth: Payer: Self-pay

## 2021-03-02 ENCOUNTER — Other Ambulatory Visit: Payer: Self-pay | Admitting: Physical Medicine and Rehabilitation

## 2021-03-03 ENCOUNTER — Ambulatory Visit: Payer: PPO | Admitting: Physical Medicine and Rehabilitation

## 2021-03-03 NOTE — Telephone Encounter (Signed)
done

## 2021-03-04 LAB — TOXASSURE SELECT,+ANTIDEPR,UR

## 2021-03-07 ENCOUNTER — Telehealth: Payer: Self-pay | Admitting: *Deleted

## 2021-03-07 ENCOUNTER — Other Ambulatory Visit: Payer: Self-pay | Admitting: *Deleted

## 2021-03-07 MED ORDER — LISINOPRIL 40 MG PO TABS: 40.0000 mg | ORAL_TABLET | Freq: Every day | ORAL | 3 refills | Status: DC

## 2021-03-07 NOTE — Telephone Encounter (Signed)
Urine drug screen for this encounter is consistent for prescribed medication 

## 2021-03-28 ENCOUNTER — Telehealth: Payer: Self-pay

## 2021-03-28 ENCOUNTER — Other Ambulatory Visit: Payer: Self-pay | Admitting: Physical Medicine and Rehabilitation

## 2021-03-28 MED ORDER — HYDROCODONE-ACETAMINOPHEN 10-325 MG PO TABS: 2.0000 | ORAL_TABLET | Freq: Four times a day (QID) | ORAL | 0 refills | Status: DC | PRN

## 2021-03-28 NOTE — Telephone Encounter (Signed)
Patient is calling to request refill on Hydrocodone. Last fill 02/28/21

## 2021-03-30 NOTE — Telephone Encounter (Signed)
Patient notified

## 2021-04-19 ENCOUNTER — Other Ambulatory Visit: Payer: Self-pay | Admitting: Physical Medicine and Rehabilitation

## 2021-04-25 ENCOUNTER — Telehealth: Payer: Self-pay

## 2021-04-25 ENCOUNTER — Other Ambulatory Visit: Payer: Self-pay | Admitting: Physical Medicine and Rehabilitation

## 2021-04-25 MED ORDER — HYDROCODONE-ACETAMINOPHEN 10-325 MG PO TABS: 2.0000 | ORAL_TABLET | Freq: Four times a day (QID) | ORAL | 0 refills | Status: DC | PRN
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
## 2021-04-25 NOTE — Telephone Encounter (Signed)
Patient is calling for a refill on Hydrocodone. Last fill per PMP was 03/28/21

## 2021-04-26 ENCOUNTER — Encounter: Payer: PPO | Attending: Registered Nurse | Admitting: Physical Medicine and Rehabilitation

## 2021-04-26 ENCOUNTER — Telehealth: Payer: Self-pay

## 2021-04-26 ENCOUNTER — Other Ambulatory Visit: Payer: Self-pay | Admitting: Physical Medicine and Rehabilitation

## 2021-04-26 ENCOUNTER — Other Ambulatory Visit: Payer: Self-pay

## 2021-04-26 VITALS — BP 164/78 | HR 74 | Temp 97.9°F | Ht 60.0 in | Wt 147.0 lb

## 2021-04-26 DIAGNOSIS — Z730 Burn-out: Secondary | ICD-10-CM | POA: Diagnosis not present

## 2021-04-26 DIAGNOSIS — G4700 Insomnia, unspecified: Secondary | ICD-10-CM | POA: Diagnosis not present

## 2021-04-26 DIAGNOSIS — M961 Postlaminectomy syndrome, not elsewhere classified: Secondary | ICD-10-CM | POA: Insufficient documentation

## 2021-04-26 MED ORDER — HYDROCODONE-ACETAMINOPHEN 10-325 MG PO TABS: 2.0000 | ORAL_TABLET | Freq: Four times a day (QID) | ORAL | 0 refills | Status: DC | PRN

## 2021-04-26 NOTE — Patient Instructions (Addendum)
Foods that can help lower blood pressure and provided with a list: 1) citrus foods- high in vitamins and minerals 2) salmon and other fatty fish - reduces inflammation and oxylipins 3) swiss chard (leafy green)- high level of nitrates 4) pumpkin seeds- one of the best natural sources of magnesium 5) Beans and lentils- high in fiber, magnesium, and potassium 6) Berries- high in flavonoids 7) Amaranth (whole grain, can be cooked similarly to rice and oats)- high in magnesium and fiber 8) Pistachios- even more effective at reducing BP than other nuts 9) Carrots- high in phenolic compounds that relax blood vessels and reduce inflammation 10) Celery- contain phthalides that relax tissues of arterial walls 11) Tomatoes- can also improve cholesterol and reduce risk of heart disease 12) Broccoli- good source of magnesium, calcium, and potassium 13) Greek yogurt: high in potassium and calcium 14) Herbs and spices: Celery seed, cilantro, saffron, lemongrass, black cumin, ginseng, cinnamon, cardamom, sweet basil, and ginger 15) Chia and flax seeds- also help to lower cholesterol and blood sugar 16) Beets- high levels of nitrates that relax blood vessels  17) spinach and bananas- high in potassium  -Provided lise of supplements that can help with hypertension:  1) magnesium: one high quality brand is Bioptemizers since it contains all 7 types of magnesium, otherwise over the counter magnesium gluconate 400mg  is a good option 2) B vitamins 3) vitamin D 4) potassium 5) CoQ10 6) L-arginine 7) Vitamin C 8) Beetroot  Foods that may reduce pain: 1) Ginger (especially studied for arthritis)- reduce leukotriene production to decrease inflammation 2) Blueberries- high in phytonutrients that decrease inflammation 3) Salmon- marine omega-3s reduce joint swelling and pain 4) Pumpkin seeds- reduce inflammation 5) dark chocolate- reduces inflammation 6) turmeric- reduces inflammation 7) tart cherries -  reduce pain and stiffness 8) extra virgin olive oil - its compound olecanthal helps to block prostaglandins  9) chili peppers- can be eaten or applied topically via capsaicin 10) mint- helpful for headache, muscle aches, joint pain, and itching 11) garlic- reduces inflammation  Link to further information on diet for chronic pain: [email or web address]

## 2021-04-26 NOTE — Telephone Encounter (Signed)
Patient called and stated the [facility] does not have [facility]. She stated they informed her that it will be the first of the year before they get some. She wants it sent to to [facility] in [place]. [facility] has been [facility].

## 2021-04-26 NOTE — Telephone Encounter (Signed)
[facility] called to get a diagnosis code for the Hydrocodone. Chronic pain syndrome G89.4 was provided to them

## 2021-04-26 NOTE — Telephone Encounter (Signed)
Left voicemail for patient informing her that prescription was sent

## 2021-05-17 ENCOUNTER — Other Ambulatory Visit: Payer: Self-pay | Admitting: Physical Medicine and Rehabilitation

## 2021-05-24 ENCOUNTER — Telehealth: Payer: Self-pay

## 2021-05-24 NOTE — Telephone Encounter (Signed)
Last visit 04/26/2021, Next visit 05/23/2021   PMP REPORT:  Filled  Drug  QTY  Days  Prescriber  Dispenser  PMP   04/26/2021  Hydrocodone-Acetamin 10-325 Mg 240.00 [address] [phone]) [place]   Also patient is looking for cheaper medication to replace the Clonidine patch. Patient has been advised to call her insurance & pharmacy. To  check on out of pocket cost similar medication. Because we do not know what  However Dr. Ranell Patrick will be informed.

## 2021-05-26 ENCOUNTER — Telehealth: Payer: Self-pay

## 2021-05-26 ENCOUNTER — Other Ambulatory Visit: Payer: Self-pay | Admitting: Physical Medicine and Rehabilitation

## 2021-05-26 MED ORDER — HYDROCODONE-ACETAMINOPHEN 10-325 MG PO TABS: 1.0000 | ORAL_TABLET | Freq: Four times a day (QID) | ORAL | 0 refills | Status: DC | PRN

## 2021-05-26 MED ORDER — CLONIDINE HCL 0.1 MG PO TABS: 0.1000 mg | ORAL_TABLET | Freq: Two times a day (BID) | ORAL | 11 refills | Status: DC

## 2021-05-26 MED ORDER — HYDROCODONE-ACETAMINOPHEN 10-325 MG PO TABS: 2.0000 | ORAL_TABLET | Freq: Four times a day (QID) | ORAL | 0 refills | Status: DC | PRN

## 2021-05-26 NOTE — Telephone Encounter (Signed)
Notified. 

## 2021-05-26 NOTE — Telephone Encounter (Signed)
Jasmin [name] called back and she will be out of her hydrocodone at the end of the day. She is asking that the refill please be sent in. Also she says the pharmacist has told her the the clonidine is cheaper if she takes the pill. The patches are about $300.

## 2021-05-26 NOTE — Telephone Encounter (Signed)
Patient called stating that her Hydrocodone prescription was wrong. She does that the prescription says take one every 6 hours and given #120 but she usually takes 2 every 6 hours and given #240. Please advise.

## 2021-05-27 NOTE — Telephone Encounter (Signed)
Pt.notified

## 2021-06-01 ENCOUNTER — Other Ambulatory Visit: Payer: Self-pay | Admitting: Physical Medicine and Rehabilitation

## 2021-06-01 ENCOUNTER — Telehealth: Payer: Self-pay

## 2021-06-01 NOTE — Telephone Encounter (Signed)
Clonidine 0.1 mg/24 hr transdermal patch denied.

## 2021-06-08 ENCOUNTER — Telehealth: Payer: Self-pay

## 2021-06-08 NOTE — Telephone Encounter (Signed)
Patient called and wanted to know if a new script for her Hydrocodone was sent in to the pharmacy. She stated when it was picked up, it was wrong and Dr. Ranell Patrick instructed her to take 2 tablets every 6 hours. Called [facility] and they stated they can fill it for patient. Attempted to call patient, but was unable to leave a voicemail

## 2021-06-24 ENCOUNTER — Ambulatory Visit: Payer: PPO | Admitting: Physical Medicine and Rehabilitation

## 2021-07-05 ENCOUNTER — Ambulatory Visit: Payer: PPO | Admitting: Physical Medicine and Rehabilitation

## 2021-07-06 ENCOUNTER — Telehealth: Payer: Self-pay

## 2021-07-06 ENCOUNTER — Other Ambulatory Visit: Payer: Self-pay | Admitting: Physical Medicine and Rehabilitation

## 2021-07-06 MED ORDER — METOPROLOL SUCCINATE ER 50 MG PO TB24
50.0000 mg | ORAL_TABLET | Freq: Every day | ORAL | 1 refills | Status: DC
Start: 2021-07-06 — End: 2022-03-27

## 2021-07-06 MED ORDER — HYDROCODONE-ACETAMINOPHEN 10-325 MG PO TABS: 2.0000 | ORAL_TABLET | Freq: Four times a day (QID) | ORAL | 0 refills | Status: DC | PRN

## 2021-07-06 NOTE — Telephone Encounter (Signed)
Patient called stating she need a refill on Hydrocodone and Metoprolol

## 2021-07-26 ENCOUNTER — Encounter: Payer: PPO | Admitting: Physical Medicine and Rehabilitation

## 2021-08-03 ENCOUNTER — Telehealth: Payer: Self-pay | Admitting: *Deleted

## 2021-08-03 MED ORDER — PREDNISONE 1 MG PO TABS: 1.0000 mg | ORAL_TABLET | Freq: Two times a day (BID) | ORAL | 3 refills | Status: DC

## 2021-08-03 NOTE — Telephone Encounter (Signed)
Jasmin [name] called for a refill on her hydrocodone. Her last appt was 04/26/21 and she is not scheduled to be seen again until 09/27/21. Her last fill date on her hydrocodone was 07/08/21. ?

## 2021-08-04 ENCOUNTER — Encounter: Payer: Self-pay | Admitting: Physical Medicine and Rehabilitation

## 2021-08-04 ENCOUNTER — Other Ambulatory Visit: Payer: Self-pay | Admitting: Physical Medicine and Rehabilitation

## 2021-08-04 MED ORDER — HYDROCODONE-ACETAMINOPHEN 10-325 MG PO TABS: 2.0000 | ORAL_TABLET | Freq: Four times a day (QID) | ORAL | 0 refills | Status: DC | PRN

## 2021-08-18 ENCOUNTER — Encounter: Payer: PPO | Admitting: Registered Nurse

## 2021-08-22 ENCOUNTER — Encounter: Payer: PPO | Attending: Registered Nurse | Admitting: Registered Nurse

## 2021-08-22 ENCOUNTER — Encounter: Payer: Self-pay | Admitting: Registered Nurse

## 2021-08-22 VITALS — BP 166/76 | HR 68 | Ht 60.0 in | Wt 153.0 lb

## 2021-08-22 DIAGNOSIS — M961 Postlaminectomy syndrome, not elsewhere classified: Secondary | ICD-10-CM | POA: Diagnosis present

## 2021-08-22 DIAGNOSIS — M25561 Pain in right knee: Secondary | ICD-10-CM | POA: Insufficient documentation

## 2021-08-22 DIAGNOSIS — Z79891 Long term (current) use of opiate analgesic: Secondary | ICD-10-CM

## 2021-08-22 DIAGNOSIS — M25512 Pain in left shoulder: Secondary | ICD-10-CM | POA: Insufficient documentation

## 2021-08-22 DIAGNOSIS — M25511 Pain in right shoulder: Secondary | ICD-10-CM | POA: Diagnosis present

## 2021-08-22 DIAGNOSIS — M797 Fibromyalgia: Secondary | ICD-10-CM

## 2021-08-22 DIAGNOSIS — M2550 Pain in unspecified joint: Secondary | ICD-10-CM | POA: Diagnosis present

## 2021-08-22 DIAGNOSIS — M542 Cervicalgia: Secondary | ICD-10-CM | POA: Diagnosis present

## 2021-08-22 DIAGNOSIS — G894 Chronic pain syndrome: Secondary | ICD-10-CM | POA: Diagnosis present

## 2021-08-22 DIAGNOSIS — M25562 Pain in left knee: Secondary | ICD-10-CM | POA: Insufficient documentation

## 2021-08-22 DIAGNOSIS — Z5181 Encounter for therapeutic drug level monitoring: Secondary | ICD-10-CM

## 2021-08-22 DIAGNOSIS — G8929 Other chronic pain: Secondary | ICD-10-CM

## 2021-08-22 NOTE — Progress Notes (Signed)
? ?Subjective:  ? ? Patient ID: Jasmin [name], female    DOB: Apr 01, 1946, 76 y.o.   MRN: [id number] ? ?HPI: Jasmin [name] is a 76 y.o. female who returns for follow up appointment for chronic pain and medication refill. She states her pain is located in her lower back, bilateral knees and also reports she has generalized pain all over. She rates her pain 8. Her current exercise regime is walking and performing stretching exercises. ? ?Jasmin [name] Morphine equivalent is 80.00 MME.   UDS ordered today. ?  ? ?Pain Inventory ?Average Pain 8 ?Pain Right Now 8 ?My pain is constant, sharp, burning, dull, stabbing, tingling, and aching ? ?In the last 24 hours, has pain interfered with the following? ?General activity 10 ?Relation with others 10 ?Enjoyment of life 10 ?What TIME of day is your pain at its worst? varies ?Sleep (in general) Poor ? ?Pain is worse with: walking, bending, sitting, inactivity, and standing ?Pain improves with: medication ?Relief from Meds: 5 ? ?Family History  ?Problem Relation Age of Onset  ? Coronary artery disease Father   ? Coronary artery disease Mother   ? Heart attack Brother   ? Coronary artery disease Sister   ? ?Social History  ? ?Socioeconomic History  ? Marital status: Married  ?  Spouse name: Not on file  ? Number of children: Not on file  ? Years of education: Not on file  ? Highest education level: Not on file  ?Occupational History  ? Not on file  ?Tobacco Use  ? Smoking status: Every Day  ?  Types: E-cigarettes  ?  Last attempt to quit: 01/13/2009  ?  Years since quitting: 12.6  ? Smokeless tobacco: Never  ?Vaping Use  ? Vaping Use: Some days  ? Substances: Nicotine  ?Substance and Sexual Activity  ? Alcohol use: No  ? Drug use: No  ? Sexual activity: Not Currently  ?Other Topics Concern  ? Not on file  ?Social History Narrative  ? Not on file  ? ?Social Determinants of Health  ? ?Financial Resource Strain: Not on file  ?Food Insecurity: Not on file  ?Transportation Needs: Not on  file  ?Physical Activity: Not on file  ?Stress: Not on file  ?Social Connections: Not on file  ? ?Past Surgical History:  ?Procedure Laterality Date  ? PTCA    ? ?Past Surgical History:  ?Procedure Laterality Date  ? PTCA    ? ?Past Medical History:  ?Diagnosis Date  ? CAD (coronary artery disease)   ? COPD (chronic obstructive pulmonary disease) ([place])   ? Dyslipidemia   ? GERD (gastroesophageal reflux disease)   ? HTN (hypertension)   ? Palpitations   ? ?There were no vitals taken for this visit. ? ?Opioid Risk Score:   ?Fall Risk Score:  `1 ? ?Depression screen PHQ 2/9 ? ? ?  02/25/2021  ?  9:08 AM 01/28/2021  ?  1:17 PM 12/24/2020  ? 12:55 PM 11/05/2019  ?  3:08 PM 10/07/2019  ? 12:55 PM 07/01/2019  ?  4:07 PM  ?Depression screen PHQ 2/9  ?Decreased Interest '1 1 1 3 3 3  '$ ?Down, Depressed, Hopeless '1 1 1 2 2 2  '$ ?PHQ - 2 Score '2 2 2 5 5 5  '$ ?Altered sleeping      3  ?Tired, decreased energy      3  ?Change in appetite      2  ?Feeling bad or failure about yourself  3  ?Trouble concentrating      2  ?Moving slowly or fidgety/restless      0  ?Suicidal thoughts      1  ?PHQ-9 Score      19  ?  ? ?Review of Systems  ?Musculoskeletal:  Positive for myalgias.  ?Neurological:  Positive for weakness and numbness.  ?All other systems reviewed and are negative. ? ?   ?Objective:  ? Physical Exam ?Vitals and nursing note reviewed.  ?Constitutional:   ?   Appearance: Normal appearance.  ?Neck:  ?   Comments: Cervical Paraspinal Tenderness: C-5-C-6 ?Cardiovascular:  ?   Rate and Rhythm: Normal rate and regular rhythm.  ?   Pulses: Normal pulses.  ?   Heart sounds: Normal heart sounds.  ?Pulmonary:  ?   Effort: Pulmonary effort is normal.  ?   Breath sounds: Normal breath sounds.  ?Musculoskeletal:  ?   Cervical back: Normal range of motion and neck supple.  ?   Right lower leg: Edema present.  ?   Left lower leg: Edema present.  ?   Comments: Normal Muscle Bulk and Muscle Testing Reveals:  ?Upper Extremities: Full ROM and  Muscle Strength 5/5 ? Lumbar Paraspinal Tenderness: L-3-L-5 ?Bilateral Greater Trochanter Tenderness ?Lower Extremities: Decreased ROM and Muscle Strength 5/5 ?Bilateral Lowe Extremities Flexion Produces Pain into her Bilateral Patella's R>L ?Arises from Table Slowly ?Narrow Based  Gait  ?   ?Skin: ?   General: Skin is warm and dry.  ?Neurological:  ?   Mental Status: She is alert and oriented to person, place, and time.  ?Psychiatric:     ?   Mood and Affect: Mood normal.     ?   Behavior: Behavior normal.  ? ? ? ? ?   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 Continue to Monitor. 08/22/2021 ?Fibromyalgia: Continue HEP as Tolerated. Continue current medication regimen and we will continue to monitor. 08/22/2020 ?Chronic Pain Syndrome: Continue: No Prescription given today. Hydrocodone 10 /325 take two tablets every 6 hours as needed for pain #240. We will continue the opioid monitoring program, this consists of regular clinic visits, examinations, urine drug screen, pill counts as well as use of [place] Controlled Substance Reporting system. A 12 month History has been reviewed on the [place] 0410/2023   ?  ?F/U in 1 month  ? ?

## 2021-08-26 LAB — TOXASSURE SELECT,+ANTIDEPR,UR

## 2021-09-01 ENCOUNTER — Telehealth: Payer: Self-pay | Admitting: *Deleted

## 2021-09-01 NOTE — Telephone Encounter (Signed)
Urine drug screen for this encounter is consistent for prescribed medication 

## 2021-09-05 ENCOUNTER — Telehealth: Payer: Self-pay | Admitting: *Deleted

## 2021-09-05 ENCOUNTER — Other Ambulatory Visit: Payer: Self-pay | Admitting: Physical Medicine and Rehabilitation

## 2021-09-05 MED ORDER — HYDROCODONE-ACETAMINOPHEN 10-325 MG PO TABS: 2.0000 | ORAL_TABLET | Freq: Four times a day (QID) | ORAL | 0 refills | Status: DC | PRN

## 2021-09-05 NOTE — Telephone Encounter (Signed)
Requesting a refill on hydrocodone. She will be out today!  Last fill date was 08/05/21 by PMP ?

## 2021-09-05 NOTE — Telephone Encounter (Signed)
Notified rx was sent. ?

## 2021-09-13 ENCOUNTER — Ambulatory Visit: Payer: PPO | Admitting: Physical Medicine and Rehabilitation

## 2021-09-15 ENCOUNTER — Encounter: Payer: Self-pay | Admitting: Family Medicine

## 2021-09-15 ENCOUNTER — Ambulatory Visit (INDEPENDENT_AMBULATORY_CARE_PROVIDER_SITE_OTHER): Payer: PPO | Admitting: Family Medicine

## 2021-09-15 VITALS — BP 152/82 | HR 69 | Temp 98.3°F | Ht 61.0 in | Wt 154.8 lb

## 2021-09-15 DIAGNOSIS — E039 Hypothyroidism, unspecified: Secondary | ICD-10-CM | POA: Diagnosis not present

## 2021-09-15 DIAGNOSIS — M069 Rheumatoid arthritis, unspecified: Secondary | ICD-10-CM | POA: Diagnosis not present

## 2021-09-15 DIAGNOSIS — E7800 Pure hypercholesterolemia, unspecified: Secondary | ICD-10-CM

## 2021-09-15 DIAGNOSIS — Z23 Encounter for immunization: Secondary | ICD-10-CM | POA: Diagnosis not present

## 2021-09-15 DIAGNOSIS — J449 Chronic obstructive pulmonary disease, unspecified: Secondary | ICD-10-CM

## 2021-09-15 DIAGNOSIS — Z8679 Personal history of other diseases of the circulatory system: Secondary | ICD-10-CM

## 2021-09-15 MED ORDER — AMLODIPINE BESYLATE 10 MG PO TABS: 10.0000 mg | ORAL_TABLET | Freq: Every day | ORAL | 3 refills | Status: DC

## 2021-09-15 NOTE — Progress Notes (Signed)
? ?Subjective:  ? ? Patient ID: Jasmin [name], female    DOB: 06-06-1945, 76 y.o.   MRN: [id number] ? ?HPI ?Patient is here today to establish care with me.  She has a history of back surgery.  She continues to deal with severe chronic low back pain.  She sees a pain clinic who prescribes her hydrocodone due to the chronic pain in her lower back.  She also has a remote history of rheumatoid arthritis per her chart for which she is taking low-dose prednisone.  She has not seen a rheumatologist in years.  She has been tried on methotrexate in the past but discontinued the medication.  She is also been tried on Plaquenil but discontinued medication.  She has a history of coronary artery disease.  She has tried and failed numerous statins.  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  She has never tried Insurance risk surveyor or Repatha.  She is overdue for colonoscopy.  She is overdue for mammogram.  She adamantly refuses these 2 test and states that she does not want any screening test performed.  She is due for the Prevnar 20 due to her smoking history.  She has a history of hypothyroidism and she is currently on Cytomel.  She is not sure why she is on this.  Synthroid.  She states that she has not had her thyroid and her cholesterol checked in quite some time.  She also has a history of coronary artery disease.  She states that she has had PTCA on several occasions.  She states that on 2 separate occasions her stent closed due to "scar tissue".  Therefore she is on aspirin and Plavix indefinitely.  She states that she has not seen her cardiologist in years.  Her blood pressure today is elevated at 152/82 ?Past Medical History:  ?Diagnosis Date  ? CAD (coronary artery disease)   ? COPD (chronic obstructive pulmonary disease) ([place])   ? Dyslipidemia   ? GERD (gastroesophageal reflux disease)   ? HTN (hypertension)   ? Palpitations   ? ?Past Surgical History:  ?Procedure Laterality Date  ? PTCA    ? ?Current Outpatient  Medications on File Prior to Visit  ?Medication Sig Dispense Refill  ? aspirin 325 MG tablet Take 325 mg by mouth daily.    ? cloNIDine (CATAPRES) 0.1 MG tablet Take 1 tablet (0.1 mg total) by mouth 2 (two) times daily. 60 tablet 11  ? clopidogrel (PLAVIX) 75 MG tablet TAKE 1 TABLET BY MOUTH EVERY DAY 90 tablet 3  ? diclofenac Sodium (VOLTAREN) 1 % GEL Apply 2 g topically 4 (four) times daily. 300 g 3  ? Fluticasone-Salmeterol 113-14 MCG/ACT AEPB Inhale into the lungs in the morning and at bedtime.    ? HYDROcodone-acetaminophen (NORCO) 10-325 MG tablet Take 2 tablets by mouth every 6 (six) hours as needed. 240 tablet 0  ? liothyronine (CYTOMEL) 5 MCG tablet Take one tablet by mouth twice a day on an empty stomach. 180 tablet 3  ? lisinopril (ZESTRIL) 40 MG tablet Take 1 tablet (40 mg total) by mouth daily. 90 tablet 3  ? metoprolol succinate (TOPROL-XL) 50 MG 24 hr tablet Take 1 tablet (50 mg total) by mouth daily. TAKE WITH OR IMMEDIATELY FOLLOWING A MEAL. 90 tablet 1  ? nitroGLYCERIN (NITROSTAT) 0.4 MG SL tablet Place 0.4 mg under the tongue every 5 (five) minutes as needed.    ? predniSONE (DELTASONE) 1 MG tablet Take 1 tablet (1 mg total) by  mouth 2 (two) times daily with a meal. 30 tablet 3  ? Skin Protectants, Misc. (EUCERIN) cream Apply topically as needed for dry skin. 454 g 0  ? hydroxychloroquine (PLAQUENIL) 200 MG tablet Take 1 tablet (200 mg total) by mouth daily. (Patient not taking: Reported on 09/15/2021) 60 tablet 1  ? topiramate (TOPAMAX) 25 MG tablet TAKE 1 TABLET BY MOUTH EVERYDAY AT BEDTIME (Patient not taking: Reported on 09/15/2021) 90 tablet 1  ? ?No current facility-administered medications on file prior to visit.  ? ? ? ?Allergies  ?Allergen Reactions  ? Crestor [Rosuvastatin]   ? ?Social History  ? ?Socioeconomic History  ? Marital status: Married  ?  Spouse name: Not on file  ? Number of children: Not on file  ? Years of education: Not on file  ? Highest education level: Not on file   ?Occupational History  ? Not on file  ?Tobacco Use  ? Smoking status: Every Day  ?  Types: E-cigarettes  ?  Last attempt to quit: 01/13/2009  ?  Years since quitting: 12.6  ? Smokeless tobacco: Never  ?Vaping Use  ? Vaping Use: Some days  ? Substances: Nicotine  ?Substance and Sexual Activity  ? Alcohol use: No  ? Drug use: No  ? Sexual activity: Not Currently  ?Other Topics Concern  ? Not on file  ?Social History Narrative  ? Not on file  ? ?Social Determinants of Health  ? ?Financial Resource Strain: Not on file  ?Food Insecurity: Not on file  ?Transportation Needs: Not on file  ?Physical Activity: Not on file  ?Stress: Not on file  ?Social Connections: Not on file  ?Intimate Partner Violence: Not on file  ? ? ? ?Review of Systems  ?All other systems reviewed and are negative. ? ?   ?Objective:  ? Physical Exam ?Vitals reviewed.  ?Constitutional:   ?   General: She is not in acute distress. ?   Appearance: Normal appearance. She is normal weight. She is not ill-appearing or toxic-appearing.  ?Cardiovascular:  ?   Rate and Rhythm: Normal rate and regular rhythm.  ?   Pulses: Normal pulses.  ?   Heart sounds: Normal heart sounds. No murmur heard. ?  No friction rub. No gallop.  ?Pulmonary:  ?   Effort: Pulmonary effort is normal. No respiratory distress.  ?   Breath sounds: Normal breath sounds. No stridor. No wheezing, rhonchi or rales.  ?Abdominal:  ?   General: Bowel sounds are normal. There is no distension.  ?   Palpations: Abdomen is soft. There is no mass.  ?   Tenderness: There is no abdominal tenderness. There is no guarding.  ?Musculoskeletal:  ?   Right lower leg: No edema.  ?   Left lower leg: No edema.  ?Neurological:  ?   Mental Status: She is alert.  ? ? ? ? ? ?   ?Assessment & Plan:  ?Hypothyroidism, unspecified type - Plan: CBC with Differential/Platelet, COMPLETE METABOLIC PANEL WITH GFR, TSH ? ?Rheumatoid arthritis, involving unspecified site, unspecified whether rheumatoid factor present ([place]) -  Plan: Sedimentation rate, Rheumatoid factor ? ?Pure hypercholesterolemia - Plan: Lipid panel ? ?Need for vaccination - Plan: Pneumococcal conjugate vaccine 20-valent (Prevnar 20) ? ?History of ASCVD ? ?Chronic obstructive pulmonary disease, unspecified COPD type ([place]) ?First her blood pressure is high.  I will have the patient discontinue clonidine and replace it with amlodipine 10 mg a day and recheck blood pressure in 1.  Second she has  hypothyroidism and is on T3 as opposed to T4.  I am not sure why.  We will check a TSH and if the levels are off I would recommend switching to Synthroid.  Third, she has a history of rheumatoid arthritis and is on no DMA RD.  Instead she is taking prednisone.  Her rheumatoid factor and sed rate are elevated I would recommend a referral to a rheumatologist for further evaluation.  Her prednisone is being prescribed on an as-needed basis by her pain clinic physician.  Fourth, the patient refuses any preventative care such as a mammogram or colonoscopy.  5th, patient has a history of coronary artery disease and is not on statin due to I will check a fasting lipid panel but I suggest the patient try Repatha or Praluent.  Wait to obtain baseline cholesterol to evaluate but I plan to suggest Repatha. ? ?

## 2021-09-16 ENCOUNTER — Ambulatory Visit: Payer: PPO | Admitting: Physical Medicine and Rehabilitation

## 2021-09-16 LAB — CBC WITH DIFFERENTIAL/PLATELET
Absolute Monocytes: 490 cells/uL (ref 200–950)
Basophils Absolute: 97 cells/uL (ref 0–200)
Basophils Relative: 1.4 %
Eosinophils Absolute: 69 cells/uL (ref 15–500)
Eosinophils Relative: 1 %
HCT: 35.8 % (ref 35.0–45.0)
Hemoglobin: 11.8 g/dL (ref 11.7–15.5)
Lymphs Abs: 1953 cells/uL (ref 850–3900)
MCH: 31.1 pg (ref 27.0–33.0)
MCHC: 33 g/dL (ref 32.0–36.0)
MCV: 94.2 fL (ref 80.0–100.0)
MPV: 9.9 fL (ref 7.5–12.5)
Monocytes Relative: 7.1 %
Neutro Abs: 4292 cells/uL (ref 1500–7800)
Neutrophils Relative %: 62.2 %
Platelets: 292 10*3/uL (ref 140–400)
RBC: 3.8 10*6/uL (ref 3.80–5.10)
RDW: 12.7 % (ref 11.0–15.0)
Total Lymphocyte: 28.3 %
WBC: 6.9 10*3/uL (ref 3.8–10.8)

## 2021-09-16 LAB — COMPLETE METABOLIC PANEL WITHOUT GFR
AG Ratio: 2.1 (calc) (ref 1.0–2.5)
ALT: 10 U/L (ref 6–29)
AST: 13 U/L (ref 10–35)
Albumin: 4.4 g/dL (ref 3.6–5.1)
Alkaline phosphatase (APISO): 105 U/L (ref 37–153)
BUN: 14 mg/dL (ref 7–25)
CO2: 25 mmol/L (ref 20–32)
Calcium: 9.5 mg/dL (ref 8.6–10.4)
Chloride: 100 mmol/L (ref 98–110)
Creat: 0.87 mg/dL (ref 0.60–1.00)
Globulin: 2.1 g/dL (ref 1.9–3.7)
Glucose, Bld: 93 mg/dL (ref 65–99)
Potassium: 5.3 mmol/L (ref 3.5–5.3)
Sodium: 133 mmol/L — ABNORMAL LOW (ref 135–146)
Total Bilirubin: 0.3 mg/dL (ref 0.2–1.2)
Total Protein: 6.5 g/dL (ref 6.1–8.1)
eGFR: 69 mL/min/1.73m2

## 2021-09-16 LAB — LIPID PANEL
Cholesterol: 236 mg/dL — ABNORMAL HIGH (ref <200)
HDL: 83 mg/dL (ref >=50)
LDL Cholesterol (Calc): 127 mg/dL (calc) — ABNORMAL HIGH
Non-HDL Cholesterol (Calc): 153 mg/dL (calc) — ABNORMAL HIGH (ref <130)
Total CHOL/HDL Ratio: 2.8 (calc) (ref <5.0)
Triglycerides: 144 mg/dL (ref <150)

## 2021-09-16 LAB — SEDIMENTATION RATE: Sed Rate: 9 mm/h (ref 0–30)

## 2021-09-16 LAB — RHEUMATOID FACTOR: Rheumatoid fact SerPl-aCnc: <14 IU/mL (ref <14)

## 2021-09-16 LAB — TSH: TSH: 1.26 mIU/L (ref 0.40–4.50)

## 2021-09-20 ENCOUNTER — Other Ambulatory Visit: Payer: Self-pay

## 2021-09-20 DIAGNOSIS — E7800 Pure hypercholesterolemia, unspecified: Secondary | ICD-10-CM

## 2021-09-21 ENCOUNTER — Telehealth: Payer: Self-pay

## 2021-09-21 MED ORDER — REPATHA SURECLICK 140 MG/ML ~~LOC~~ SOAJ: 140.0000 mg | SUBCUTANEOUS | 2 refills | Status: DC

## 2021-09-21 NOTE — Telephone Encounter (Signed)
Jasmin [name] (Key: [facility]) ?Rx #: [id number] ?Repatha SureClick '140MG'$ /ML auto-injectors ? ?PA send to plan ?

## 2021-09-26 NOTE — Telephone Encounter (Signed)
Jasmin [name] (Key: [facility]) ?Rx #: [id number] ?Repatha SureClick '140MG'$ /ML auto-injectors ?  ?1st- PA Appeal send to plan ?

## 2021-09-26 NOTE — Telephone Encounter (Signed)
Kathi Der (Key: [facility]) ?Rx #: [id number] ?Repatha SureClick '140MG'$ /ML auto-injectors ? ? ?Your prior authorization request has been denied. ?

## 2021-09-27 ENCOUNTER — Encounter: Payer: PPO | Attending: Registered Nurse | Admitting: Physical Medicine and Rehabilitation

## 2021-09-27 ENCOUNTER — Encounter: Payer: Self-pay | Admitting: Physical Medicine and Rehabilitation

## 2021-09-27 VITALS — BP 158/81 | HR 58 | Ht 61.0 in | Wt 154.4 lb

## 2021-09-27 DIAGNOSIS — I10 Essential (primary) hypertension: Secondary | ICD-10-CM | POA: Diagnosis present

## 2021-09-27 DIAGNOSIS — G894 Chronic pain syndrome: Secondary | ICD-10-CM | POA: Diagnosis present

## 2021-09-27 MED ORDER — HYDROCODONE-ACETAMINOPHEN 10-325 MG PO TABS: 2.0000 | ORAL_TABLET | Freq: Four times a day (QID) | ORAL | 0 refills | Status: DC | PRN

## 2021-09-27 MED ORDER — AMITRIPTYLINE HCL 10 MG PO TABS: 10.0000 mg | ORAL_TABLET | Freq: Every day | ORAL | 1 refills | Status: DC
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
## 2021-09-28 ENCOUNTER — Telehealth: Payer: Self-pay

## 2021-09-28 NOTE — Telephone Encounter (Signed)
Pt's Prior Josem Kaufmann has been denied the [place].  ? ?Requesting alternative, please advice ?

## 2021-09-30 MED ORDER — EZETIMIBE 10 MG PO TABS: 10.0000 mg | ORAL_TABLET | Freq: Every day | ORAL | 3 refills | Status: DC

## 2021-09-30 NOTE — Telephone Encounter (Signed)
Rx sent to pharmacy, pt aware. 

## 2021-10-03 ENCOUNTER — Telehealth: Payer: Self-pay | Admitting: Physical Medicine and Rehabilitation

## 2021-10-03 NOTE — Telephone Encounter (Signed)
Patient calling regarding refill on her hydrocodone

## 2021-10-14 ENCOUNTER — Telehealth: Payer: Self-pay

## 2021-10-14 MED ORDER — LIOTHYRONINE SODIUM 5 MCG PO TABS: ORAL_TABLET | ORAL | 3 refills | Status: DC

## 2021-10-14 NOTE — Telephone Encounter (Signed)
Medication sent to pharmacy  

## 2021-10-14 NOTE — Telephone Encounter (Signed)
Pharmacy is sending a refill request for Liothyronine

## 2021-10-25 ENCOUNTER — Other Ambulatory Visit: Payer: Self-pay | Admitting: Physical Medicine and Rehabilitation

## 2021-10-26 ENCOUNTER — Telehealth: Payer: Self-pay | Admitting: Family Medicine

## 2021-10-26 NOTE — Telephone Encounter (Signed)
Left message for patient to call back and schedule Medicare Annual Wellness Visit (AWV).   Please offer to do virtually or by telephone.  Left office number and my jabber #[phone].  AWVI eligible as of  05/15/2009  Please schedule at anytime with Nurse Health Advisor.   

## 2021-10-28 ENCOUNTER — Other Ambulatory Visit: Payer: Self-pay

## 2021-10-28 MED ORDER — HYDROCODONE-ACETAMINOPHEN 10-325 MG PO TABS: 2.0000 | ORAL_TABLET | Freq: Four times a day (QID) | ORAL | 0 refills | Status: DC | PRN

## 2021-10-28 NOTE — Telephone Encounter (Signed)
   Medication refill request:  PMP REPORT:  Filled  Written  ID  Drug  QTY  Days  Prescriber  RX #  Dispenser  Refill  Daily Dose*  Pymt Type  PMP  10/02/2021 09/27/2021 1  Hydrocodone-Acetamin 10-325 Mg 240.00 30 Kr Rau [id number] Nor ([id number]) 0/0 80.00 MME Private Pay [place] 09/05/2021 09/05/2021 1  Hydrocodone-Acetamin 10-325 Mg 240.00 30 Kr Rau [id number] Nor ([id number]) 0/0 80.00 MME Private Pay

## 2021-10-30 ENCOUNTER — Other Ambulatory Visit: Payer: Self-pay | Admitting: Physical Medicine and Rehabilitation

## 2021-11-03 NOTE — Patient Instructions (Incomplete)
Jasmin [name] , Thank you for taking time to come for your Medicare Wellness Visit. I appreciate your ongoing commitment to your health goals. Please review the following plan we discussed and let me know if I can assist you in the future.   Screening recommendations/referrals: Colonoscopy: Discussed. Mammogram: Due yearly. Bone Density: Due every 2 years.  Recommended yearly ophthalmology/optometry visit for glaucoma screening and checkup Recommended yearly dental visit for hygiene and checkup  Vaccinations: Influenza vaccine: Due Fall 2023. Pneumococcal vaccine: [id number] done 09/15/2021. Tdap vaccine: Due every 10 years. Shingles vaccine: Available at your local pharmacy.   Covid-19:Declined.   Advanced directives: ***  Conditions/risks identified: Aim for 30 minutes of exercise or brisk walking, 6-8 glasses of water, and 5 servings of fruits and vegetables each day.   Next appointment: Follow up in one year for your annual wellness visit 2024.   Preventive Care 3 Years and Older, Female Preventive care refers to lifestyle choices and visits with your health care provider that can promote health and wellness. What does preventive care include? A yearly physical exam. This is also called an annual well check. Dental exams once or twice a year. Routine eye exams. Ask your health care provider how often you should have your eyes checked. Personal lifestyle choices, including: Daily care of your teeth and gums. Regular physical activity. Eating a healthy diet. Avoiding tobacco and drug use. Limiting alcohol use. Practicing safe sex. Taking low-dose aspirin every day. Taking vitamin and mineral supplements as recommended by your health care provider. What happens during an annual well check? The services and screenings done by your health care provider during your annual well check will depend on your age, overall health, lifestyle risk factors, and family history of  disease. Counseling  Your health care provider may ask you questions about your: Alcohol use. Tobacco use. Drug use. Emotional well-being. Home and relationship well-being. Sexual activity. Eating habits. History of falls. Memory and ability to understand (cognition). Work and work Statistician. Reproductive health. Screening  You may have the following tests or measurements: Height, weight, and BMI. Blood pressure. Lipid and cholesterol levels. These may be checked every 5 years, or more frequently if you are over 68 years old. Skin check. Lung cancer screening. You may have this screening every year starting at age 66 if you have a 30-pack-year history of smoking and currently smoke or have quit within the past 15 years. Fecal occult blood test (FOBT) of the stool. You may have this test every year starting at age 50. Flexible sigmoidoscopy or colonoscopy. You may have a sigmoidoscopy every 5 years or a colonoscopy every 10 years starting at age 35. Hepatitis C blood test. Hepatitis B blood test. Sexually transmitted disease (STD) testing. Diabetes screening. This is done by checking your blood sugar (glucose) after you have not eaten for a while (fasting). You may have this done every 1-3 years. Bone density scan. This is done to screen for osteoporosis. You may have this done starting at age 40. Mammogram. This may be done every 1-2 years. Talk to your health care provider about how often you should have regular mammograms. Talk with your health care provider about your test results, treatment options, and if necessary, the need for more tests. Vaccines  Your health care provider may recommend certain vaccines, such as: Influenza vaccine. This is recommended every year. Tetanus, diphtheria, and acellular pertussis (Tdap, Td) vaccine. You may need a Td booster every 10 years. Zoster vaccine. You may need  this after age 25. Pneumococcal 13-valent conjugate (PCV13) vaccine. One  dose is recommended after age 92. Pneumococcal polysaccharide (PPSV23) vaccine. One dose is recommended after age 64. Talk to your health care provider about which screenings and vaccines you need and how often you need them. This information is not intended to replace advice given to you by your health care provider. Make sure you discuss any questions you have with your health care provider. Document Released: 05/28/2015 Document Revised: 01/19/2016 Document Reviewed: 03/02/2015 Elsevier Interactive Patient Education  2017 [place] Prevention in the Home Falls can cause injuries. They can happen to people of all ages. There are many things you can do to make your home safe and to help prevent falls. What can I do on the outside of my home? Regularly fix the edges of walkways and driveways and fix any cracks. Remove anything that might make you trip as you walk through a door, such as a raised step or threshold. Trim any bushes or trees on the path to your home. Use bright outdoor lighting. Clear any walking paths of anything that might make someone trip, such as rocks or tools. Regularly check to see if handrails are loose or broken. Make sure that both sides of any steps have handrails. Any raised decks and porches should have guardrails on the edges. Have any leaves, snow, or ice cleared regularly. Use sand or salt on walking paths during winter. Clean up any spills in your garage right away. This includes oil or grease spills. What can I do in the bathroom? Use night lights. Install grab bars by the toilet and in the tub and shower. Do not use towel bars as grab bars. Use non-skid mats or decals in the tub or shower. If you need to sit down in the shower, use a plastic, non-slip stool. Keep the floor dry. Clean up any water that spills on the floor as soon as it happens. Remove soap buildup in the tub or shower regularly. Attach bath mats securely with double-sided  non-slip rug tape. Do not have throw rugs and other things on the floor that can make you trip. What can I do in the bedroom? Use night lights. Make sure that you have a light by your bed that is easy to reach. Do not use any sheets or blankets that are too big for your bed. They should not hang down onto the floor. Have a firm chair that has side arms. You can use this for support while you get dressed. Do not have throw rugs and other things on the floor that can make you trip. What can I do in the kitchen? Clean up any spills right away. Avoid walking on wet floors. Keep items that you use a lot in easy-to-reach places. If you need to reach something above you, use a strong step stool that has a grab bar. Keep electrical cords out of the way. Do not use floor polish or wax that makes floors slippery. If you must use wax, use non-skid floor wax. Do not have throw rugs and other things on the floor that can make you trip. What can I do with my stairs? Do not leave any items on the stairs. Make sure that there are handrails on both sides of the stairs and use them. Fix handrails that are broken or loose. Make sure that handrails are as long as the stairways. Check any carpeting to make sure that it is firmly attached to  the stairs. Fix any carpet that is loose or worn. Avoid having throw rugs at the top or bottom of the stairs. If you do have throw rugs, attach them to the floor with carpet tape. Make sure that you have a light switch at the top of the stairs and the bottom of the stairs. If you do not have them, ask someone to add them for you. What else can I do to help prevent falls? Wear shoes that: Do not have high heels. Have rubber bottoms. Are comfortable and fit you well. Are closed at the toe. Do not wear sandals. If you use a stepladder: Make sure that it is fully opened. Do not climb a closed stepladder. Make sure that both sides of the stepladder are locked into place. Ask  someone to hold it for you, if possible. Clearly mark and make sure that you can see: Any grab bars or handrails. First and last steps. Where the edge of each step is. Use tools that help you move around (mobility aids) if they are needed. These include: Canes. Walkers. Scooters. Crutches. Turn on the lights when you go into a dark area. Replace any light bulbs as soon as they burn out. Set up your furniture so you have a clear path. Avoid moving your furniture around. If any of your floors are uneven, fix them. If there are any pets around you, be aware of where they are. Review your medicines with your doctor. Some medicines can make you feel dizzy. This can increase your chance of falling. Ask your doctor what other things that you can do to help prevent falls. This information is not intended to replace advice given to you by your health care provider. Make sure you discuss any questions you have with your health care provider. Document Released: 02/25/2009 Document Revised: 10/07/2015 Document Reviewed: 06/05/2014 Elsevier Interactive Patient Education  2017 [employer].

## 2021-11-04 ENCOUNTER — Ambulatory Visit (INDEPENDENT_AMBULATORY_CARE_PROVIDER_SITE_OTHER): Payer: PPO

## 2021-11-04 VITALS — Ht 61.0 in | Wt 154.0 lb

## 2021-11-04 DIAGNOSIS — Z0000 Encounter for general adult medical examination without abnormal findings: Secondary | ICD-10-CM

## 2021-11-22 ENCOUNTER — Ambulatory Visit: Payer: PPO | Admitting: Physical Medicine and Rehabilitation

## 2021-11-25 ENCOUNTER — Telehealth: Payer: Self-pay

## 2021-11-25 ENCOUNTER — Other Ambulatory Visit: Payer: Self-pay | Admitting: Physical Medicine and Rehabilitation

## 2021-11-25 MED ORDER — HYDROCODONE-ACETAMINOPHEN 10-325 MG PO TABS: 2.0000 | ORAL_TABLET | Freq: Four times a day (QID) | ORAL | 0 refills | Status: DC | PRN

## 2021-11-25 NOTE — Telephone Encounter (Signed)
Patient is calling for Hydrocodone refill
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
## 2021-12-06 ENCOUNTER — Encounter: Payer: Self-pay | Admitting: Physical Medicine and Rehabilitation

## 2021-12-06 ENCOUNTER — Encounter: Payer: PPO | Attending: Registered Nurse | Admitting: Physical Medicine and Rehabilitation

## 2021-12-06 VITALS — BP 135/79 | HR 57 | Ht 61.0 in | Wt 153.4 lb

## 2021-12-06 DIAGNOSIS — I10 Essential (primary) hypertension: Secondary | ICD-10-CM | POA: Diagnosis not present

## 2021-12-06 DIAGNOSIS — G894 Chronic pain syndrome: Secondary | ICD-10-CM

## 2021-12-06 MED ORDER — PREDNISONE 1 MG PO TABS: 1.0000 mg | ORAL_TABLET | Freq: Every day | ORAL | 3 refills | Status: DC | PRN

## 2021-12-23 ENCOUNTER — Telehealth: Payer: Self-pay

## 2021-12-23 ENCOUNTER — Other Ambulatory Visit: Payer: Self-pay | Admitting: Physical Medicine and Rehabilitation

## 2021-12-23 MED ORDER — HYDROCODONE-ACETAMINOPHEN 10-325 MG PO TABS: 2.0000 | ORAL_TABLET | Freq: Four times a day (QID) | ORAL | 0 refills | Status: DC | PRN

## 2021-12-23 NOTE — Telephone Encounter (Signed)
   PMP REPORT:   Filled  Written  ID  Drug  QTY  Days  Prescriber  RX #  Dispenser  Refill  Daily Dose*  Pymt Type  PMP  01/01/2020 01/01/2020 1  Hydrocodone-Acetamin 10-325 Mg 224.00 28 Kr [address] [id number] Nor (0109) 0/0 80.00 MME [employer] [place]

## 2022-01-19 ENCOUNTER — Telehealth: Payer: Self-pay | Admitting: *Deleted

## 2022-01-19 NOTE — Telephone Encounter (Signed)
Mrs Farone called and requested a refill on her hydrocodone.  Her last fill date was 12/24/21. Due this weekend.

## 2022-01-20 ENCOUNTER — Other Ambulatory Visit: Payer: Self-pay | Admitting: Physical Medicine and Rehabilitation

## 2022-01-20 MED ORDER — HYDROCODONE-ACETAMINOPHEN 10-325 MG PO TABS: 2.0000 | ORAL_TABLET | Freq: Four times a day (QID) | ORAL | 0 refills | Status: DC | PRN

## 2022-01-20 NOTE — Telephone Encounter (Signed)
Notified. 

## 2022-01-23 ENCOUNTER — Ambulatory Visit: Payer: PPO | Admitting: Physical Medicine and Rehabilitation

## 2022-01-23 ENCOUNTER — Encounter: Payer: PPO | Attending: Registered Nurse | Admitting: Physical Medicine and Rehabilitation

## 2022-01-23 VITALS — BP 147/82 | HR 64 | Ht 61.0 in | Wt 149.2 lb

## 2022-01-23 DIAGNOSIS — I10 Essential (primary) hypertension: Secondary | ICD-10-CM | POA: Insufficient documentation

## 2022-01-23 DIAGNOSIS — Z730 Burn-out: Secondary | ICD-10-CM | POA: Insufficient documentation

## 2022-01-23 DIAGNOSIS — G894 Chronic pain syndrome: Secondary | ICD-10-CM | POA: Diagnosis present
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
## 2022-01-23 NOTE — Patient Instructions (Signed)
Seroquel 12.5, 25, 50  Foods that may reduce pain: 1) Ginger (especially studied for arthritis)- reduce leukotriene production to decrease inflammation 2) Blueberries- high in phytonutrients that decrease inflammation 3) Salmon- marine omega-3s reduce joint swelling and pain 4) Pumpkin seeds- reduce inflammation 5) dark chocolate- reduces inflammation 6) turmeric- reduces inflammation 7) tart cherries - reduce pain and stiffness 8) extra virgin olive oil - its compound olecanthal helps to block prostaglandins  9) chili peppers- can be eaten or applied topically via capsaicin 10) mint- helpful for headache, muscle aches, joint pain, and itching 11) garlic- reduces inflammation  Link to further information on diet for chronic pain: [email or web address]

## 2022-01-25 ENCOUNTER — Other Ambulatory Visit: Payer: Self-pay | Admitting: Physical Medicine and Rehabilitation

## 2022-02-06 ENCOUNTER — Ambulatory Visit: Payer: PPO | Admitting: Registered Nurse

## 2022-02-16 ENCOUNTER — Telehealth: Payer: Self-pay

## 2022-02-16 NOTE — Telephone Encounter (Signed)
PMP REPORT:   Filled  Written  ID  Drug  QTY  Days  Prescriber  RX #  Dispenser  Refill  Daily Dose*  Pymt Type  PMP  01/21/2022 01/20/2022 1  Hydrocodone-Acetamin 10-325 Mg 240.00 30 Kr Rau [id number] Nor ([id number]) 0/0 80.00 MME Private Pay [place] 12/24/2021 12/23/2021 1  Hydrocodone-Acetamin 10-325 Mg 240.00 30 Kr Rau [id number] Nor ([id number]) 0/0 80.00 MME Private Pay [place]

## 2022-02-17 ENCOUNTER — Other Ambulatory Visit: Payer: Self-pay | Admitting: Physical Medicine and Rehabilitation

## 2022-02-17 MED ORDER — HYDROCODONE-ACETAMINOPHEN 10-325 MG PO TABS: 2.0000 | ORAL_TABLET | Freq: Four times a day (QID) | ORAL | 0 refills | Status: DC | PRN

## 2022-02-22 ENCOUNTER — Emergency Department: Payer: PPO

## 2022-02-22 ENCOUNTER — Other Ambulatory Visit: Payer: Self-pay

## 2022-02-22 ENCOUNTER — Emergency Department
Admission: EM | Admit: 2022-02-22 | Discharge: 2022-02-22 | Discharge status: Against Medical Advice | Payer: PPO | Attending: Emergency Medicine | Admitting: Emergency Medicine

## 2022-02-22 DIAGNOSIS — Y92410 Unspecified street and highway as the place of occurrence of the external cause: Secondary | ICD-10-CM | POA: Diagnosis not present

## 2022-02-22 DIAGNOSIS — Z5321 Procedure and treatment not carried out due to patient leaving prior to being seen by health care provider: Secondary | ICD-10-CM | POA: Diagnosis not present

## 2022-02-22 DIAGNOSIS — M791 Myalgia, unspecified site: Secondary | ICD-10-CM | POA: Insufficient documentation

## 2022-02-22 DIAGNOSIS — R0781 Pleurodynia: Secondary | ICD-10-CM | POA: Insufficient documentation
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
## 2022-02-23 ENCOUNTER — Telehealth: Payer: Self-pay | Admitting: *Deleted

## 2022-02-23 NOTE — Telephone Encounter (Signed)
Jasmin [name] was called, she was instructed to call the community pharmacies to locate the hydrocodone. Once she finds it, to call the office.  She verbalizes understanding.

## 2022-02-23 NOTE — Telephone Encounter (Signed)
Jasmin [name] called for her hydrocodone 10/325 , the [facility] does not have .  She did not say she had checked anywhere.

## 2022-02-24 ENCOUNTER — Telehealth: Payer: Self-pay | Admitting: *Deleted

## 2022-02-24 MED ORDER — HYDROCODONE-ACETAMINOPHEN 10-325 MG PO TABS: 2.0000 | ORAL_TABLET | Freq: Four times a day (QID) | ORAL | 0 refills | Status: DC | PRN

## 2022-02-24 NOTE — Addendum Note (Signed)
Addended by: Bayard Hugger on: 02/24/2022 10:30 AM   Modules accepted: Orders

## 2022-02-24 NOTE — Telephone Encounter (Signed)
PMP was Reviewed.,  Hydrocodone e-scribed today.  Jasmin [name] made aware via My- Chart message.

## 2022-02-24 NOTE — Telephone Encounter (Signed)
[facility] on [address] in [place] has # 30 of the hydrocodone but must be sent in right away, because they cannot guarantee if not received this morning.

## 2022-03-03 ENCOUNTER — Ambulatory Visit (INDEPENDENT_AMBULATORY_CARE_PROVIDER_SITE_OTHER): Payer: PPO | Admitting: Family Medicine

## 2022-03-03 ENCOUNTER — Ambulatory Visit
Admission: RE | Admit: 2022-03-03 | Discharge: 2022-03-03 | Disposition: A | Discharge status: Home / Self Care | Payer: PPO | Source: Ambulatory Visit | Attending: Family Medicine | Admitting: Family Medicine

## 2022-03-03 VITALS — BP 120/68 | HR 71 | Ht 60.0 in | Wt 143.6 lb

## 2022-03-03 DIAGNOSIS — R051 Acute cough: Secondary | ICD-10-CM | POA: Diagnosis not present

## 2022-03-03 DIAGNOSIS — R091 Pleurisy: Secondary | ICD-10-CM | POA: Diagnosis present

## 2022-03-03 DIAGNOSIS — R110 Nausea: Secondary | ICD-10-CM | POA: Diagnosis not present

## 2022-03-03 MED ORDER — AMOXICILLIN-POT CLAVULANATE 875-125 MG PO TABS: 1.0000 | ORAL_TABLET | Freq: Two times a day (BID) | ORAL | 0 refills | Status: DC
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
## 2022-03-08 ENCOUNTER — Telehealth: Payer: Self-pay

## 2022-03-08 NOTE — Telephone Encounter (Signed)
CHEST XRAY RESULTS:  Pt's daughter, Maudie Mercury, called and asks if you can review pt's chest x-ray for them as she is leaving to go back home tomorrow. Does patient need anything additional? Thank you!

## 2022-03-14 ENCOUNTER — Telehealth: Payer: Self-pay

## 2022-03-14 NOTE — Telephone Encounter (Signed)
Patient called for her pain medication. Please send to [facility] in [place] on [address].   PMP:  Filled  Written  ID  Drug  QTY  Days  Prescriber  RX #  Dispenser  Refill  Daily Dose*  Pymt Type  PMP  02/24/2022 02/24/2022 1  Hydrocodone-Acetamin 10-325 Mg 120.00 15 Eu Tho [id number] [facility] ([id number]) 0/0 80.00 MME Medicare [place] 01/21/2022 01/20/2022 1  Hydrocodone-Acetamin 10-325 Mg 240.00 30 Kr Rau [id number] [facility] ([id number]) 0/0 80.00 MME Private Pay [place] 12/24/2021 12/23/2021 1  Hydrocodone-Acetamin 10-325 Mg 240.00 30 Kr Rau [id number] [facility] ([id number]) 0/0 80.00 MME Private Pay [place]

## 2022-03-15 ENCOUNTER — Other Ambulatory Visit: Payer: Self-pay | Admitting: Physical Medicine and Rehabilitation

## 2022-03-15 MED ORDER — HYDROCODONE-ACETAMINOPHEN 10-325 MG PO TABS: 2.0000 | ORAL_TABLET | Freq: Four times a day (QID) | ORAL | 0 refills | Status: DC | PRN

## 2022-03-17 ENCOUNTER — Telehealth: Payer: Self-pay | Admitting: *Deleted

## 2022-03-17 MED ORDER — HYDROCODONE-ACETAMINOPHEN 10-325 MG PO TABS: 2.0000 | ORAL_TABLET | Freq: Four times a day (QID) | ORAL | 0 refills | Status: DC | PRN

## 2022-03-17 NOTE — Telephone Encounter (Signed)
PMP was Reviewed.  Hydrocodone e-scribed today.  Placed a call to Ms. Ermalinda Barrios, no answer. Left message to return the call.

## 2022-03-17 NOTE — Telephone Encounter (Signed)
Mrs Trovato called and said that [facility] on [address] in [place] had the medication (I hope they still do). Can you send I to that pharmacy?

## 2022-03-27 ENCOUNTER — Ambulatory Visit (INDEPENDENT_AMBULATORY_CARE_PROVIDER_SITE_OTHER): Payer: PPO | Admitting: Family Medicine

## 2022-03-27 VITALS — BP 122/82 | HR 71 | Ht 60.0 in | Wt 143.2 lb

## 2022-03-27 DIAGNOSIS — R002 Palpitations: Secondary | ICD-10-CM

## 2022-03-27 DIAGNOSIS — M75101 Unspecified rotator cuff tear or rupture of right shoulder, not specified as traumatic: Secondary | ICD-10-CM | POA: Diagnosis not present

## 2022-03-27 DIAGNOSIS — R0609 Other forms of dyspnea: Secondary | ICD-10-CM | POA: Diagnosis not present

## 2022-03-27 MED ORDER — LIOTHYRONINE SODIUM 5 MCG PO TABS: ORAL_TABLET | ORAL | 3 refills | Status: DC

## 2022-03-27 MED ORDER — METOPROLOL SUCCINATE ER 50 MG PO TB24: 50.0000 mg | ORAL_TABLET | Freq: Every day | ORAL | 3 refills | Status: DC

## 2022-03-27 NOTE — Addendum Note (Signed)
Addended by: Jenna Luo T on: 03/27/2022 03:58 PM   Modules accepted: Orders
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
## 2022-03-28 ENCOUNTER — Encounter: Payer: PPO | Attending: Registered Nurse | Admitting: Physical Medicine and Rehabilitation

## 2022-03-28 VITALS — BP 160/81 | HR 72 | Ht 60.0 in | Wt 143.4 lb

## 2022-03-28 DIAGNOSIS — S43421S Sprain of right rotator cuff capsule, sequela: Secondary | ICD-10-CM

## 2022-03-28 DIAGNOSIS — Z5181 Encounter for therapeutic drug level monitoring: Secondary | ICD-10-CM | POA: Diagnosis not present

## 2022-03-28 DIAGNOSIS — Z79891 Long term (current) use of opiate analgesic: Secondary | ICD-10-CM

## 2022-03-28 DIAGNOSIS — G894 Chronic pain syndrome: Secondary | ICD-10-CM | POA: Diagnosis not present

## 2022-03-28 LAB — BASIC METABOLIC PANEL WITH GFR
BUN: 8 mg/dL (ref 7–25)
CO2: 28 mmol/L (ref 20–32)
Calcium: 9.8 mg/dL (ref 8.6–10.4)
Chloride: 100 mmol/L (ref 98–110)
Creat: 0.69 mg/dL (ref 0.60–1.00)
Glucose, Bld: 91 mg/dL (ref 65–99)
Potassium: 4.8 mmol/L (ref 3.5–5.3)
Sodium: 136 mmol/L (ref 135–146)
eGFR: 89 mL/min/{1.73_m2} (ref >=60)

## 2022-03-28 LAB — CBC WITH DIFFERENTIAL/PLATELET
Absolute Monocytes: 585 cells/uL (ref 200–950)
Basophils Absolute: 133 cells/uL (ref 0–200)
Basophils Relative: 1.7 %
Eosinophils Absolute: 211 cells/uL (ref 15–500)
Eosinophils Relative: 2.7 %
HCT: 32.9 % — ABNORMAL LOW (ref 35.0–45.0)
Hemoglobin: 11.1 g/dL — ABNORMAL LOW (ref 11.7–15.5)
Lymphs Abs: 2441 cells/uL (ref 850–3900)
MCH: 31.4 pg (ref 27.0–33.0)
MCHC: 33.7 g/dL (ref 32.0–36.0)
MCV: 92.9 fL (ref 80.0–100.0)
MPV: 10.1 fL (ref 7.5–12.5)
Monocytes Relative: 7.5 %
Neutro Abs: 4430 cells/uL (ref 1500–7800)
Neutrophils Relative %: 56.8 %
Platelets: 328 10*3/uL (ref 140–400)
RBC: 3.54 10*6/uL — ABNORMAL LOW (ref 3.80–5.10)
RDW: 12.8 % (ref 11.0–15.0)
Total Lymphocyte: 31.3 %
WBC: 7.8 10*3/uL (ref 3.8–10.8)

## 2022-03-28 LAB — BRAIN NATRIURETIC PEPTIDE: Brain Natriuretic Peptide: 126 pg/mL — ABNORMAL HIGH (ref <100)

## 2022-03-28 NOTE — Patient Instructions (Signed)

## 2022-03-30 ENCOUNTER — Telehealth: Payer: Self-pay | Admitting: *Deleted

## 2022-03-30 LAB — TOXASSURE SELECT,+ANTIDEPR,UR

## 2022-03-30 NOTE — Telephone Encounter (Signed)
Urine drug screen is not as expected. Hydrocodone and its metabolites are expected and present, but there is also metabolites of oxymorphone. This is not a prescribed medication for Jasmin [name]. This is her first aberrant drug screen. I will send a formal warning via USPS.

## 2022-04-03 ENCOUNTER — Telehealth: Payer: Self-pay

## 2022-04-03 NOTE — Telephone Encounter (Signed)
Return pt's phone call regarding echo? LVM

## 2022-04-05 ENCOUNTER — Other Ambulatory Visit: Payer: Self-pay

## 2022-04-05 DIAGNOSIS — R002 Palpitations: Secondary | ICD-10-CM

## 2022-04-05 DIAGNOSIS — R0609 Other forms of dyspnea: Secondary | ICD-10-CM

## 2022-04-10 ENCOUNTER — Encounter: Payer: Self-pay | Admitting: Family Medicine

## 2022-04-10 ENCOUNTER — Encounter: Payer: Self-pay | Admitting: Cardiovascular Disease

## 2022-04-11 ENCOUNTER — Ambulatory Visit: Payer: PPO | Admitting: Interventional Cardiology

## 2022-04-12 ENCOUNTER — Telehealth: Payer: Self-pay | Admitting: *Deleted

## 2022-04-12 NOTE — Telephone Encounter (Signed)
I spoke with Mrs Jasmin [name] about her urine drug screen. She left a message on clinic line that she did not know what we were talking about in her letter she received. I reviewed PMP and there has been nothing rx for her except the hydrocodone. She denies ever taking anything but her medication. I explained the amount was too high to be a possible contaminate and because there are metabolites she definitely ingested it.   Oxymorphone                    1435                    ng/mg creat    Noroxymorphone                 212                     ng/mg creat   After more talking she remembered she took some of her husband's medication he had when we were having difficulty finding the hydrocodone acetaminophen 10/325 due to a Producer, television/film/video. She says she talked to Dr Ranell Patrick and she said it was ok for her to do this. She was able to find her husband's bottle and it was oxycodone. This solves the mystery around her drug screen. She was cautioned not to take anything but what she is prescribed and she reiterated that Dr Ranell Patrick gave her approval.

## 2022-04-13 ENCOUNTER — Ambulatory Visit: Payer: PPO | Admitting: Internal Medicine

## 2022-04-14 ENCOUNTER — Other Ambulatory Visit: Payer: Self-pay | Admitting: Physical Medicine and Rehabilitation

## 2022-04-14 ENCOUNTER — Telehealth: Payer: Self-pay | Admitting: *Deleted

## 2022-04-14 MED ORDER — OXYCODONE-ACETAMINOPHEN 10-325 MG PO TABS: 1.0000 | ORAL_TABLET | ORAL | 0 refills | Status: DC | PRN

## 2022-04-14 MED ORDER — HYDROCODONE-ACETAMINOPHEN 10-325 MG PO TABS: 2.0000 | ORAL_TABLET | Freq: Four times a day (QID) | ORAL | 0 refills | Status: DC | PRN

## 2022-04-14 NOTE — Telephone Encounter (Signed)
Eunice sent in the hydrocodone and [facility] does not have it.She says you said that she could have oxycodone and [facility] has that.

## 2022-04-14 NOTE — Telephone Encounter (Signed)
Jasmin [name] has called about her regular pharmacy does not have the medication. She would like her rx to be sent to the [facility] on [address] in [place].

## 2022-04-14 NOTE — Telephone Encounter (Signed)
PMP was Reviewed.  Hydrocodone e-scribed today.

## 2022-04-16 ENCOUNTER — Encounter: Payer: Self-pay | Admitting: Cardiovascular Disease

## 2022-04-16 NOTE — Progress Notes (Unsigned)
Cardiology Office Note:    Date:  04/17/2022   ID:  Jasmin, [name] 09/10/1945, MRN [id number]  PCP:  Jasmin Frizzle, MD   [facility] Cardiologist:  Jasmin [name]   Referring MD: Jasmin Frizzle, MD   Chief Complaint  Patient presents with   Hypertension         History of Present Illness:    Jasmin [name] is a 76 y.o. female with a hx of HTN, HLD, COPD, fibromyalgia, palpitations   I saw her many years ago.    Is having more palpitations  She still smokes  MAST cell syndrome ( hyperallgic symptoms )  Has had several coronary stents  Had severe muscle aches with rosuvastatin  She may need a PCSK9 inhibitor   Will draw labs today  Will place a 14 day monitor for further eval of her  palpitations    Past Medical History:  Diagnosis Date   Arthritis    CAD (coronary artery disease)    COPD (chronic obstructive pulmonary disease) (HCC)    Dyslipidemia    Fibromyalgia    GERD (gastroesophageal reflux disease)    HTN (hypertension)    Palpitations    Rheumatoid arthritis ([place])    Thyroid disease     Past Surgical History:  Procedure Laterality Date   BACK SURGERY     remote, no history for me to review.   PTCA      Current Medications: Current Meds  Medication Sig   cloNIDine (CATAPRES) 0.1 MG tablet Take 1 tablet (0.1 mg total) by mouth 2 (two) times daily.   clopidogrel (PLAVIX) 75 MG tablet TAKE 1 TABLET BY MOUTH EVERY DAY   liothyronine (CYTOMEL) 5 MCG tablet Take one tablet by mouth twice a day on an empty stomach.   lisinopril (ZESTRIL) 40 MG tablet TAKE 1 TABLET BY MOUTH EVERY DAY   metoprolol succinate (TOPROL-XL) 50 MG 24 hr tablet Take 1 tablet (50 mg total) by mouth daily. TAKE WITH OR IMMEDIATELY FOLLOWING A MEAL.   propranolol (INDERAL) 10 MG tablet Take 1 tablet by mouth up to four times daily as needed for palpitations     Allergies:   Crestor [rosuvastatin]   Social History   Socioeconomic History   Marital  status: Married    Spouse name: Jasmin [name]   Number of children: 4   Years of education: Not on file   Highest education level: Not on file  Occupational History   Not on file  Tobacco Use   Smoking status: Every Day    Types: E-cigarettes    Last attempt to quit: 01/13/2009    Years since quitting: 13.2   Smokeless tobacco: Never  Vaping Use   Vaping Use: Some days   Substances: Nicotine  Substance and Sexual Activity   Alcohol use: No   Drug use: No   Sexual activity: Not Currently  Other Topics Concern   Not on file  Social History Narrative   Married in 1963.   4 grown children.   Social Determinants of Health   Financial Resource Strain: Low Risk  (11/04/2021)   Overall Financial Resource Strain (CARDIA)    Difficulty of Paying Living Expenses: Not hard at all  Food Insecurity: No Food Insecurity (11/04/2021)   Hunger Vital Sign    Worried About Running Out of Food in the Last Year: Never true    Ran Out of Food in the Last Year: Never true  Transportation Needs: No Transportation  Needs (11/04/2021)   PRAPARE - Hydrologist (Medical): No    Lack of Transportation (Non-Medical): No  Physical Activity: Insufficiently Active (11/04/2021)   Exercise Vital Sign    Days of Exercise per Week: 5 days    Minutes of Exercise per Session: 10 min  Stress: No Stress Concern Present (11/04/2021)   [place]    Feeling of Stress : Not at all  Social Connections: Moderately Integrated (11/04/2021)   Social Connection and Isolation Panel [NHANES]    Frequency of Communication with Friends and Family: More than three times a week    Frequency of Social Gatherings with Friends and Family: Once a week    Attends Religious Services: 1 to 4 times per year    Active Member of [employer] or Organizations: No    Attends Archivist Meetings: Never    Marital Status: Married     Family  History: The patient's family history includes Coronary artery disease in her father, mother, and sister; Heart attack in her brother.  ROS:   Please see the history of present illness.     All other systems reviewed and are negative.  EKGs/Labs/Other Studies Reviewed:    The following studies were reviewed today:   EKG:   Dec. 4, 2023.  NSR at 70.  Normal ECG   Recent Labs: 09/15/2021: ALT 10; TSH 1.26 03/27/2022: Brain Natriuretic Peptide 126; BUN 8; Creat 0.69; Hemoglobin 11.1; Platelets 328; Potassium 4.8; Sodium 136  Recent Lipid Panel    Component Value Date/Time   CHOL 236 (H) 09/15/2021 1530   TRIG 144 09/15/2021 1530   HDL 83 09/15/2021 1530   CHOLHDL 2.8 09/15/2021 1530   LDLCALC 127 (H) 09/15/2021 1530     Risk Assessment/Calculations:               Physical Exam:    VS:  BP 138/78   Pulse 70   Ht 5' (1.524 m)   Wt 139 lb (63 kg)   SpO2 97%   BMI 27.15 kg/m     Wt Readings from Last 3 Encounters:  04/17/22 139 lb (63 kg)  03/28/22 143 lb 6.4 oz (65 kg)  03/27/22 143 lb 3.2 oz (65 kg)     GEN:  elderly female,   in no acute distress HEENT: Normal NECK: No JVD; No carotid bruits LYMPHATICS: No lymphadenopathy CARDIAC: RR , soft systolic murmur radiating to below L breast  RESPIRATORY:   mild wheezes bilaterally  ABDOMEN: Soft, non-tender, non-distended MUSCULOSKELETAL:  No edema; No deformity  SKIN: Warm and dry NEUROLOGIC:  Alert and oriented x 3 PSYCHIATRIC:  Normal affect   ASSESSMENT:    1. Palpitations   2. Coronary artery disease involving native coronary artery of native heart without angina pectoris   3. Nonrheumatic mitral valve regurgitation   4. Hyperlipidemia LDL goal <70    PLAN:    In order of problems listed above:  Palpitations: Jasmin [name] presents for further evaluation of her palpitations.  Clinically the sound like premature atrial contractions or perhaps premature ventricular contractions.  Will place a 14-day monitor on  her for further evaluation.  2.  History of coronary artery disease: Jasmin [name] has a history of PCI in the early 2000's.  I placed several stents in her at that time.  Unfortunately she continues to smoke some.  We have encouraged her to stop smoking.  She is not having any angina.  3.  Hyperlipidemia: Last lipid levels are mildly elevated.  She is intolerant to rosuvastatin.  She seems to be tolerating Zetia.  Given her history of coronary artery disease her LDL goal is 55.  I would have a low threshold to refer her for lipid clinic evaluation to consider PCSK9 inhibitors.  4.  COPD: Encouraged smoking cessation.           Medication Adjustments/Labs and Tests Ordered: Current medicines are reviewed at length with the patient today.  Concerns regarding medicines are outlined above.  Orders Placed This Encounter  Procedures   Lipid panel   ALT   Basic metabolic panel   Lipoprotein A (LPA)   LONG TERM MONITOR (3-14 DAYS)   EKG 12-Lead   ECHOCARDIOGRAM COMPLETE   Meds ordered this encounter  Medications   nitroGLYCERIN (NITROSTAT) 0.4 MG SL tablet    Sig: Dissolve 1 tablet under the tongue every 5 minutes as needed for chest pain. Max of 3 doses, then 911.    Dispense:  25 tablet    Refill:  6   propranolol (INDERAL) 10 MG tablet    Sig: Take 1 tablet by mouth up to four times daily as needed for palpitations    Dispense:  120 tablet    Refill:  4    Patient Instructions  Medication Instructions:  TAKE Propranolol '10mg'$  up to four times daily as needed for palpitations  *If you need a refill on your cardiac medications before your next appointment, please call your pharmacy*   Lab Work: Lipids, ALT, BMET and Lp(a) today If you have labs (blood work) drawn today and your tests are completely normal, you will receive your results only by: MyChart Message (if you have MyChart) OR A paper copy in the mail If you have any lab test that is abnormal or we need to change your  treatment, we will call you to review the results.   Testing/Procedures: ECHO Your physician has requested that you have an echocardiogram. Echocardiography is a painless test that uses sound waves to create images of your heart. It provides your doctor with information about the size and shape of your heart and how well your heart's chambers and valves are working. This procedure takes approximately one hour. There are no restrictions for this procedure. Please do NOT wear cologne, perfume, aftershave, or lotions (deodorant is allowed). Please arrive 15 minutes prior to your appointment time.  14-day zio monitor Your physician has recommended that you wear an event monitor. Event monitors are medical devices that record the heart's electrical activity. Doctors most often [place] these monitors to diagnose arrhythmias. Arrhythmias are problems with the speed or rhythm of the heartbeat. The monitor is a small, portable device. You can wear one while you do your normal daily activities. This is usually used to diagnose what is causing palpitations/syncope (passing out).  Follow-Up: At [facility], you and your health needs are our priority.  As part of our continuing mission to provide you with exceptional heart care, we have created designated Provider Care Teams.  These Care Teams include your primary Cardiologist (physician) and Advanced Practice Providers (APPs -  Physician Assistants and Nurse Practitioners) who all work together to provide you with the care you need, when you need it.  Your next appointment:   3 month(s)  The format for your next appointment:   In Person  Provider:   Mertie Moores, MD     Bryn Gulling- Long Term Monitor Instructions  Your physician has requested you wear a ZIO patch monitor for 14 days.  This is a single patch monitor. [employer] supplies one patch monitor per enrollment. Additional stickers are not available. Please do not apply patch if you will be  having a Nuclear Stress Test,  Echocardiogram, Cardiac CT, MRI, or Chest Xray during the period you would be wearing the  monitor. The patch cannot be worn during these tests. You cannot remove and re-apply the  ZIO XT patch monitor.  Your ZIO patch monitor will be mailed 3 day USPS to your address on file. It may take 3-5 days  to receive your monitor after you have been enrolled.  Once you have received your monitor, please review the enclosed instructions. Your monitor  has already been registered assigning a specific monitor serial # to you.  Billing and Patient Assistance Program Information  We have supplied [employer] with any of your insurance information on file for billing purposes. [employer] offers a sliding scale Patient Assistance Program for patients that do not have  insurance, or whose insurance does not completely cover the cost of the ZIO monitor.  You must apply for the Patient Assistance Program to qualify for this discounted rate.  To apply, please call [employer] at [phone], select option 4, select option 2, ask to apply for  Patient Assistance Program. Theodore Demark will ask your household income, and how many people  are in your household. They will quote your out-of-pocket cost based on that information.  [employer] will also be able to set up a 37-month interest-free payment plan if needed.  Applying the monitor   Shave hair from upper left chest.  Hold abrader disc by orange tab. Rub abrader in 40 strokes over the upper left chest as  indicated in your monitor instructions.  Clean area with 4 enclosed alcohol pads. Let dry.  Apply patch as indicated in monitor instructions. Patch will be placed under collarbone on left  side of chest with arrow pointing upward.  Rub patch adhesive wings for 2 minutes. Remove white label marked "1". Remove the white  label marked "2". Rub patch adhesive wings for 2 additional minutes.  While looking in a mirror, press and release button  in center of patch. A small green light will  flash 3-4 times. This will be your only indicator that the monitor has been turned on.  Do not shower for the first 24 hours. You may shower after the first 24 hours.  Press the button if you feel a symptom. You will hear a small click. Record Date, Time and  Symptom in the Patient Logbook.  When you are ready to remove the patch, follow instructions on the last 2 pages of Patient  Logbook. Stick patch monitor onto the last page of Patient Logbook.  Place Patient Logbook in the blue and white box. Use locking tab on box and tape box closed  securely. The blue and white box has prepaid postage on it. Please place it in the mailbox as  soon as possible. Your physician should have your test results approximately 7 days after the  monitor has been [facility]  Call [place]at [phone] you have questions regarding  your ZIO XT patch monitor. Call them immediately if you see an orange light blinking on your  monitor.  If your monitor falls off in less than 4 days, contact our Monitor department at [phone]  If your monitor becomes loose or falls off  after 4 days call [employer] at [phone] for  suggestions on securing your monitor   Important Information About Sugar         Signed, Mertie Moores, MD  04/17/2022 12:29 PM    [place]

## 2022-04-17 ENCOUNTER — Ambulatory Visit: Payer: PPO | Attending: Cardiovascular Disease | Admitting: Cardiovascular Disease

## 2022-04-17 ENCOUNTER — Ambulatory Visit (INDEPENDENT_AMBULATORY_CARE_PROVIDER_SITE_OTHER): Payer: PPO

## 2022-04-17 ENCOUNTER — Encounter: Payer: Self-pay | Admitting: Cardiovascular Disease

## 2022-04-17 VITALS — BP 138/78 | HR 70 | Ht 60.0 in | Wt 139.0 lb

## 2022-04-17 DIAGNOSIS — E785 Hyperlipidemia, unspecified: Secondary | ICD-10-CM

## 2022-04-17 DIAGNOSIS — I340 Nonrheumatic mitral (valve) insufficiency: Secondary | ICD-10-CM

## 2022-04-17 DIAGNOSIS — R002 Palpitations: Secondary | ICD-10-CM

## 2022-04-17 DIAGNOSIS — I2510 Atherosclerotic heart disease of native coronary artery without angina pectoris: Secondary | ICD-10-CM

## 2022-04-17 MED ORDER — NITROGLYCERIN 0.4 MG SL SUBL: SUBLINGUAL_TABLET | SUBLINGUAL | 6 refills | Status: DC

## 2022-04-17 MED ORDER — PROPRANOLOL HCL 10 MG PO TABS: ORAL_TABLET | ORAL | 4 refills | Status: DC

## 2022-04-17 NOTE — Patient Instructions (Signed)
Medication Instructions:  TAKE Propranolol '10mg'$  up to four times daily as needed for palpitations  *If you need a refill on your cardiac medications before your next appointment, please call your pharmacy*   Lab Work: Lipids, ALT, BMET and Lp(a) today If you have labs (blood work) drawn today and your tests are completely normal, you will receive your results only by: [facility] (if you have MyChart) OR A paper copy in the mail If you have any lab test that is abnormal or we need to change your treatment, we will call you to review the results.   Testing/Procedures: ECHO Your physician has requested that you have an echocardiogram. Echocardiography is a painless test that uses sound waves to create images of your heart. It provides your doctor with information about the size and shape of your heart and how well your heart's chambers and valves are working. This procedure takes approximately one hour. There are no restrictions for this procedure. Please do NOT wear cologne, perfume, aftershave, or lotions (deodorant is allowed). Please arrive 15 minutes prior to your appointment time.  14-day zio monitor Your physician has recommended that you wear an event monitor. Event monitors are medical devices that record the heart's electrical activity. Doctors most often [place] these monitors to diagnose arrhythmias. Arrhythmias are problems with the speed or rhythm of the heartbeat. The monitor is a small, portable device. You can wear one while you do your normal daily activities. This is usually used to diagnose what is causing palpitations/syncope (passing out).  Follow-Up: At [facility], you and your health needs are our priority.  As part of our continuing mission to provide you with exceptional heart care, we have created designated Provider Care Teams.  These Care Teams include your primary Cardiologist (physician) and Advanced Practice Providers (APPs -  Physician Assistants and  Nurse Practitioners) who all work together to provide you with the care you need, when you need it.  Your next appointment:   3 month(s)  The format for your next appointment:   In Person  Provider:   Mertie Moores, MD     [place] Instructions  Your physician has requested you wear a ZIO patch monitor for 14 days.  This is a single patch monitor. [employer] supplies one patch monitor per enrollment. Additional stickers are not available. Please do not apply patch if you will be having a Nuclear Stress Test,  Echocardiogram, Cardiac CT, MRI, or Chest Xray during the period you would be wearing the  monitor. The patch cannot be worn during these tests. You cannot remove and re-apply the  ZIO XT patch monitor.  Your ZIO patch monitor will be mailed 3 day USPS to your address on file. It may take 3-5 days  to receive your monitor after you have been enrolled.  Once you have received your monitor, please review the enclosed instructions. Your monitor  has already been registered assigning a specific monitor serial # to you.  Billing and Patient Assistance Program Information  We have supplied [employer] with any of your insurance information on file for billing purposes. [employer] offers a sliding scale Patient Assistance Program for patients that do not have  insurance, or whose insurance does not completely cover the cost of the ZIO monitor.  You must apply for the Patient Assistance Program to qualify for this discounted rate.  To apply, please call [employer] at [phone], select option 4, select option 2, ask to apply for  Patient  Assistance Program. Theodore Demark will ask your household income, and how many people  are in your household. They will quote your out-of-pocket cost based on that information.  [employer] will also be able to set up a 46-month interest-free payment plan if needed.  Applying the monitor   Shave hair from upper left chest.  Hold abrader disc by  orange tab. Rub abrader in 40 strokes over the upper left chest as  indicated in your monitor instructions.  Clean area with 4 enclosed alcohol pads. Let dry.  Apply patch as indicated in monitor instructions. Patch will be placed under collarbone on left  side of chest with arrow pointing upward.  Rub patch adhesive wings for 2 minutes. Remove white label marked "1". Remove the white  label marked "2". Rub patch adhesive wings for 2 additional minutes.  While looking in a mirror, press and release button in center of patch. A small green light will  flash 3-4 times. This will be your only indicator that the monitor has been turned on.  Do not shower for the first 24 hours. You may shower after the first 24 hours.  Press the button if you feel a symptom. You will hear a small click. Record Date, Time and  Symptom in the Patient Logbook.  When you are ready to remove the patch, follow instructions on the last 2 pages of Patient  Logbook. Stick patch monitor onto the last page of Patient Logbook.  Place Patient Logbook in the blue and white box. Use locking tab on box and tape box closed  securely. The blue and white box has prepaid postage on it. Please place it in the mailbox as  soon as possible. Your physician should have your test results approximately 7 days after the  [facility] has [facility]  Call [place] [place]at [phone] you have questions regarding  your ZIO XT patch monitor. Call them immediately if you see an orange light blinking on your  monitor.  If your monitor falls off in less than 4 days, contact our Monitor department at [phone]  If your monitor becomes loose or falls off after 4 days call [employer] at [phone]  suggestions on securing your monitor   Important Information About Sugar

## 2022-04-17 NOTE — Progress Notes (Unsigned)
Enrolled for [employer] to mail a ZIO XT long term holter monitor to the patients address on file.   Dr. Nahser to read. 

## 2022-04-18 LAB — BASIC METABOLIC PANEL
BUN/Creatinine Ratio: 14 (ref 12–28)
BUN: 9 mg/dL (ref 8–27)
CO2: 26 mmol/L (ref 20–29)
Calcium: 9.6 mg/dL (ref 8.7–10.3)
Chloride: 100 mmol/L (ref 96–106)
Creatinine, Ser: 0.64 mg/dL (ref 0.57–1.00)
Glucose: 98 mg/dL (ref 70–99)
Potassium: 5.2 mmol/L (ref 3.5–5.2)
Sodium: 138 mmol/L (ref 134–144)
eGFR: 91 mL/min/{1.73_m2} (ref >59)

## 2022-04-18 LAB — LIPOPROTEIN A (LPA): Lipoprotein (a): 192.3 nmol/L — ABNORMAL HIGH (ref <75.0)

## 2022-04-18 LAB — LIPID PANEL
Chol/HDL Ratio: 2.4 ratio (ref 0.0–4.4)
Cholesterol, Total: 184 mg/dL (ref 100–199)
HDL: 78 mg/dL (ref >39)
LDL Chol Calc (NIH): 83 mg/dL (ref 0–99)
Triglycerides: 135 mg/dL (ref 0–149)
VLDL Cholesterol Cal: 23 mg/dL (ref 5–40)

## 2022-04-18 LAB — ALT: ALT: 15 IU/L (ref 0–32)

## 2022-04-21 ENCOUNTER — Telehealth: Payer: Self-pay | Admitting: Cardiovascular Disease

## 2022-04-21 DIAGNOSIS — E7841 Elevated Lipoprotein(a): Secondary | ICD-10-CM

## 2022-04-21 NOTE — Telephone Encounter (Signed)
-----   Message from Thayer Headings, MD sent at 04/19/2022  9:11 AM EST ----- Hx of CAD  Does not tolerate statins LDL is 83,  goal LDL is<55 Lp(a) is 192. Please refer to lipid clinic for consideration of PCSK9 inhibitor

## 2022-04-21 NOTE — Telephone Encounter (Signed)
Called and spoke with patient who agrees to lipid clinic referral. Order placed at this time.

## 2022-04-23 ENCOUNTER — Other Ambulatory Visit: Payer: Self-pay | Admitting: Physical Medicine and Rehabilitation

## 2022-04-23 DIAGNOSIS — I340 Nonrheumatic mitral (valve) insufficiency: Secondary | ICD-10-CM | POA: Diagnosis not present

## 2022-04-23 DIAGNOSIS — I2510 Atherosclerotic heart disease of native coronary artery without angina pectoris: Secondary | ICD-10-CM

## 2022-04-23 DIAGNOSIS — E785 Hyperlipidemia, unspecified: Secondary | ICD-10-CM

## 2022-04-23 DIAGNOSIS — R002 Palpitations: Secondary | ICD-10-CM | POA: Diagnosis not present

## 2022-04-24 ENCOUNTER — Ambulatory Visit: Payer: PPO | Admitting: Cardiovascular Disease

## 2022-05-04 ENCOUNTER — Other Ambulatory Visit: Payer: Self-pay | Admitting: Physical Medicine and Rehabilitation

## 2022-05-10 ENCOUNTER — Telehealth: Payer: Self-pay

## 2022-05-10 NOTE — Telephone Encounter (Signed)
Patient called stating she needs a refill on Hydrocodone/APAP #240 sent to [facility] in [place]. Pt states Oxycodone was sent in last time because pharmacy didn't have Hydrocodone but they do now.

## 2022-05-11 ENCOUNTER — Ambulatory Visit (HOSPITAL_COMMUNITY): Payer: PPO | Attending: Cardiology

## 2022-05-11 ENCOUNTER — Other Ambulatory Visit: Payer: Self-pay | Admitting: Physical Medicine and Rehabilitation

## 2022-05-11 DIAGNOSIS — I2510 Atherosclerotic heart disease of native coronary artery without angina pectoris: Secondary | ICD-10-CM | POA: Diagnosis present

## 2022-05-11 DIAGNOSIS — R002 Palpitations: Secondary | ICD-10-CM | POA: Diagnosis present

## 2022-05-11 DIAGNOSIS — E785 Hyperlipidemia, unspecified: Secondary | ICD-10-CM | POA: Insufficient documentation

## 2022-05-11 DIAGNOSIS — I340 Nonrheumatic mitral (valve) insufficiency: Secondary | ICD-10-CM | POA: Diagnosis present

## 2022-05-11 LAB — ECHOCARDIOGRAM COMPLETE
AR max vel: 1.72 cm2
AV Area VTI: 1.86 cm2
AV Area mean vel: 1.78 cm2
AV Mean grad: 9 mmHg
AV Peak grad: 20.3 mmHg
Ao pk vel: 2.25 m/s
Area-P 1/2: 2.79 cm2
S' Lateral: 2.2 cm

## 2022-05-11 MED ORDER — HYDROCODONE-ACETAMINOPHEN 10-325 MG PO TABS: 2.0000 | ORAL_TABLET | Freq: Four times a day (QID) | ORAL | 0 refills | Status: DC | PRN

## 2022-05-16 ENCOUNTER — Ambulatory Visit: Payer: PPO | Admitting: Family Medicine

## 2022-05-19 ENCOUNTER — Encounter: Payer: Self-pay | Admitting: Family Medicine

## 2022-05-19 ENCOUNTER — Ambulatory Visit (INDEPENDENT_AMBULATORY_CARE_PROVIDER_SITE_OTHER): Payer: PPO | Admitting: Family Medicine

## 2022-05-19 VITALS — BP 124/72 | HR 78 | Ht 60.0 in | Wt 138.0 lb

## 2022-05-19 DIAGNOSIS — R110 Nausea: Secondary | ICD-10-CM | POA: Diagnosis not present

## 2022-05-19 DIAGNOSIS — R1013 Epigastric pain: Secondary | ICD-10-CM

## 2022-05-19 DIAGNOSIS — R6881 Early satiety: Secondary | ICD-10-CM

## 2022-05-19 MED ORDER — PANTOPRAZOLE SODIUM 40 MG PO TBEC: 40.0000 mg | DELAYED_RELEASE_TABLET | Freq: Every day | ORAL | 3 refills | Status: DC
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
## 2022-05-23 ENCOUNTER — Encounter: Payer: PPO | Admitting: Physical Medicine and Rehabilitation

## 2022-06-09 ENCOUNTER — Other Ambulatory Visit: Payer: Self-pay | Admitting: Physical Medicine and Rehabilitation

## 2022-06-09 ENCOUNTER — Telehealth: Payer: Self-pay | Admitting: Physical Medicine and Rehabilitation

## 2022-06-09 MED ORDER — OXYCODONE-ACETAMINOPHEN 10-325 MG PO TABS: 1.0000 | ORAL_TABLET | Freq: Four times a day (QID) | ORAL | 0 refills | Status: DC | PRN

## 2022-06-09 MED ORDER — HYDROCODONE-ACETAMINOPHEN 10-325 MG PO TABS: 2.0000 | ORAL_TABLET | Freq: Four times a day (QID) | ORAL | 0 refills | Status: DC | PRN

## 2022-06-09 NOTE — Telephone Encounter (Signed)
Requesting refill for Hydrocodone.

## 2022-06-22 ENCOUNTER — Other Ambulatory Visit: Payer: Self-pay | Admitting: Physical Medicine and Rehabilitation

## 2022-06-22 MED ORDER — OXYCODONE-ACETAMINOPHEN 10-325 MG PO TABS: 2.0000 | ORAL_TABLET | Freq: Four times a day (QID) | ORAL | 0 refills | Status: DC | PRN

## 2022-06-27 ENCOUNTER — Encounter: Payer: PPO | Attending: Registered Nurse | Admitting: Physical Medicine and Rehabilitation

## 2022-06-27 VITALS — BP 147/88 | HR 66 | Ht 60.0 in | Wt 138.0 lb

## 2022-06-27 DIAGNOSIS — I10 Essential (primary) hypertension: Secondary | ICD-10-CM | POA: Diagnosis not present

## 2022-06-27 DIAGNOSIS — M961 Postlaminectomy syndrome, not elsewhere classified: Secondary | ICD-10-CM | POA: Insufficient documentation
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
## 2022-06-27 NOTE — Patient Instructions (Signed)
Foods that may reduce pain: 1) Ginger (especially studied for arthritis)- reduce leukotriene production to decrease inflammation 2) Blueberries- high in phytonutrients that decrease inflammation 3) Salmon- marine omega-3s reduce joint swelling and pain 4) Pumpkin seeds- reduce inflammation 5) dark chocolate- reduces inflammation 6) turmeric- reduces inflammation 7) tart cherries - reduce pain and stiffness 8) extra virgin olive oil - its compound olecanthal helps to block prostaglandins  9) chili peppers- can be eaten or applied topically via capsaicin 10) mint- helpful for headache, muscle aches, joint pain, and itching 11) garlic- reduces inflammation  Link to further information on diet for chronic pain: [email or web address]

## 2022-07-14 ENCOUNTER — Other Ambulatory Visit: Payer: Self-pay | Admitting: Cardiovascular Disease

## 2022-07-14 DIAGNOSIS — R002 Palpitations: Secondary | ICD-10-CM

## 2022-07-14 DIAGNOSIS — I340 Nonrheumatic mitral (valve) insufficiency: Secondary | ICD-10-CM

## 2022-07-14 DIAGNOSIS — I2510 Atherosclerotic heart disease of native coronary artery without angina pectoris: Secondary | ICD-10-CM

## 2022-07-14 DIAGNOSIS — E785 Hyperlipidemia, unspecified: Secondary | ICD-10-CM

## 2022-07-27 ENCOUNTER — Other Ambulatory Visit: Payer: Self-pay | Admitting: Physical Medicine and Rehabilitation

## 2022-07-27 ENCOUNTER — Telehealth: Payer: Self-pay

## 2022-07-27 MED ORDER — OXYCODONE-ACETAMINOPHEN 10-325 MG PO TABS: 2.0000 | ORAL_TABLET | Freq: Four times a day (QID) | ORAL | 0 refills | Status: DC | PRN

## 2022-07-27 NOTE — Telephone Encounter (Signed)
Patient requesting refill on Oxycodone per pmp last fill was 07/02/22   Filled  Written  ID  Drug  QTY  Days  Prescriber  RX #  Dispenser  Refill  Daily Dose*  Pymt Type  PMP  07/02/2022 06/22/2022 1  Oxycodone-Acetaminophen 10-325 240.00 30 Kr [address]au [id number] Nor ([id number]) 0/0 120.00 MME Private Pay [place]

## 2022-08-03 ENCOUNTER — Encounter: Payer: Self-pay | Admitting: Cardiovascular Disease

## 2022-08-03 NOTE — Progress Notes (Signed)
This encounter was created in error - please disregard.

## 2022-08-04 ENCOUNTER — Ambulatory Visit: Payer: PPO | Attending: Cardiovascular Disease | Admitting: Cardiovascular Disease

## 2022-08-14 ENCOUNTER — Other Ambulatory Visit: Payer: Self-pay | Admitting: Family Medicine

## 2022-08-15 DIAGNOSIS — Z0000 Encounter for general adult medical examination without abnormal findings: Secondary | ICD-10-CM | POA: Diagnosis not present

## 2022-08-25 ENCOUNTER — Telehealth: Payer: Self-pay

## 2022-08-25 ENCOUNTER — Other Ambulatory Visit: Payer: Self-pay | Admitting: Physical Medicine and Rehabilitation

## 2022-08-25 MED ORDER — OXYCODONE-ACETAMINOPHEN 10-325 MG PO TABS: 2.0000 | ORAL_TABLET | Freq: Four times a day (QID) | ORAL | 0 refills | Status: DC | PRN

## 2022-08-25 NOTE — Telephone Encounter (Signed)
Patient requesting refill for oxycodone last fill per pmp    Filled  Written  ID  Drug  QTY  Days  Prescriber  RX #  Dispenser  Refill  Daily Dose*  Pymt Type  PMP  07/30/2022 07/27/2022 1  Oxycodone-Acetaminophen 10-325 240.00 30 Kr Rau [id number] Nor ([id number]) 0/0 120.00 MME Private Pay [place]

## 2022-08-28 ENCOUNTER — Encounter: Payer: PPO | Attending: Registered Nurse | Admitting: Physical Medicine and Rehabilitation

## 2022-08-28 VITALS — BP 154/79 | HR 66 | Ht 60.0 in | Wt 136.0 lb

## 2022-08-28 DIAGNOSIS — Z79891 Long term (current) use of opiate analgesic: Secondary | ICD-10-CM | POA: Insufficient documentation

## 2022-08-28 DIAGNOSIS — M961 Postlaminectomy syndrome, not elsewhere classified: Secondary | ICD-10-CM | POA: Diagnosis not present

## 2022-08-28 DIAGNOSIS — Z5181 Encounter for therapeutic drug level monitoring: Secondary | ICD-10-CM | POA: Diagnosis not present

## 2022-08-28 DIAGNOSIS — G894 Chronic pain syndrome: Secondary | ICD-10-CM | POA: Insufficient documentation

## 2022-08-28 NOTE — Progress Notes (Unsigned)
Subjective:    Patient ID: Jasmin [name], female    DOB: 1945/12/26, 77 y.o.   MRN: [id number]  HPI Pain Inventory Average Pain 6 Pain Right Now 5 My pain is sharp, burning, dull, stabbing, tingling, and aching  In the last 24 hours, has pain interfered with the following? General activity 6 Relation with others 1 Enjoyment of life 6 What TIME of day is your pain at its worst? morning , daytime, evening, and night Sleep (in general) Poor  Pain is worse with: walking, bending, sitting, inactivity, standing, and some activites Pain improves with: medication Relief from Meds: 6  Family History  Problem Relation Age of Onset   Coronary artery disease Father    Coronary artery disease Mother    Heart attack Brother    Coronary artery disease Sister    Social History   Socioeconomic History   Marital status: Married    Spouse name: Clement Husbands   Number of children: 4   Years of education: Not on file   Highest education level: Not on file  Occupational History   Not on file  Tobacco Use   Smoking status: Every Day    Types: E-cigarettes    Last attempt to quit: 01/13/2009    Years since quitting: 13.6   Smokeless tobacco: Never  Vaping Use   Vaping Use: Some days   Substances: Nicotine  Substance and Sexual Activity   Alcohol use: No   Drug use: No   Sexual activity: Not Currently  Other Topics Concern   Not on file  Social History Narrative   Married in 1963.   4 grown children.   Social Determinants of Health   Financial Resource Strain: Low Risk  (11/04/2021)   Overall Financial Resource Strain (CARDIA)    Difficulty of Paying Living Expenses: Not hard at all  Food Insecurity: No Food Insecurity (11/04/2021)   Hunger Vital Sign    Worried About Running Out of Food in the Last Year: Never true    Ran Out of Food in the Last Year: Never true  Transportation Needs: No Transportation Needs (11/04/2021)   PRAPARE - Administrator, Civil Service  (Medical): No    Lack of Transportation (Non-Medical): No  Physical Activity: Insufficiently Active (11/04/2021)   Exercise Vital Sign    Days of Exercise per Week: 5 days    Minutes of Exercise per Session: 10 min  Stress: No Stress Concern Present (11/04/2021)   [employer] of Occupational Health - Occupational Stress Questionnaire    Feeling of Stress : Not at all  Social Connections: Moderately Integrated (11/04/2021)   Social Connection and Isolation Panel [NHANES]    Frequency of Communication with Friends and Family: More than three times a week    Frequency of Social Gatherings with Friends and Family: Once a week    Attends Religious Services: 1 to 4 times per year    Active Member of [employer] or Organizations: No    Attends Banker Meetings: Never    Marital Status: Married   Past Surgical History:  Procedure Laterality Date   BACK SURGERY     remote, no history for me to review.   PTCA     Past Surgical History:  Procedure Laterality Date   BACK SURGERY     remote, no history for me to review.   PTCA     Past Medical History:  Diagnosis Date   Arthritis    CAD (coronary  artery disease)    COPD (chronic obstructive pulmonary disease) (HCC)    Dyslipidemia    Fibromyalgia    GERD (gastroesophageal reflux disease)    HTN (hypertension)    Palpitations    Rheumatoid arthritis (HCC)    Thyroid disease    BP (!) 154/79   Pulse 66   Ht 5' (1.524 m)   Wt 136 lb (61.7 kg)   SpO2 97%   BMI 26.56 kg/m   Opioid Risk Score:   Fall Risk Score:  `1  Depression screen PHQ 2/9     08/28/2022    1:33 PM 05/19/2022    2:21 PM 03/27/2022    3:33 PM 03/03/2022   11:26 AM 12/06/2021    9:40 AM 11/04/2021    2:13 PM 09/27/2021    1:56 PM  Depression screen PHQ 2/9  Decreased Interest 1 0 0 3 0 1 3  Down, Depressed, Hopeless 0 0 0 3 1 1 3   PHQ - 2 Score 1 0 0 6 1 2 6   Altered sleeping  0 0 3  0   Tired, decreased energy  0 0 3  0   Change in appetite   0 0 0  0   Feeling bad or failure about yourself   0 0 2  0   Trouble concentrating  0 0 1  0   Moving slowly or fidgety/restless  0 0 0     Suicidal thoughts  0 0 0  0   PHQ-9 Score  0 0 15  2   Difficult doing work/chores  Not difficult at all Not difficult at all Very difficult  Not difficult at all       Review of Systems  Musculoskeletal:        B/L leg  and arm pain   All other systems reviewed and are negative.      Objective:   Physical Exam        Assessment & Plan:

## 2022-08-28 NOTE — Progress Notes (Deleted)
Pain Inventory Average Pain 6 Pain Right Now 5 My pain is sharp, burning, dull, stabbing, tingling, and aching  In the last 24 hours, has pain interfered with the following? General activity 6 Relation with others 1 Enjoyment of life 6 What TIME of day is your pain at its worst? varies Sleep (in general) Poor  Pain is worse with: walking, bending, sitting, inactivity, standing, and some activites Pain improves with: medication Relief from Meds: 6  Family History  Problem Relation Age of Onset   Coronary artery disease Father    Coronary artery disease Mother    Heart attack Brother    Coronary artery disease Sister    Social History   Socioeconomic History   Marital status: Married    Spouse name: Clement Husbands   Number of children: 4   Years of education: Not on file   Highest education level: Not on file  Occupational History   Not on file  Tobacco Use   Smoking status: Every Day    Types: E-cigarettes    Last attempt to quit: 01/13/2009    Years since quitting: 13.6   Smokeless tobacco: Never  Vaping Use   Vaping Use: Some days   Substances: Nicotine  Substance and Sexual Activity   Alcohol use: No   Drug use: No   Sexual activity: Not Currently  Other Topics Concern   Not on file  Social History Narrative   Married in 1963.   4 grown children.   Social Determinants of Health   Financial Resource Strain: Low Risk  (11/04/2021)   Overall Financial Resource Strain (CARDIA)    Difficulty of Paying Living Expenses: Not hard at all  Food Insecurity: No Food Insecurity (11/04/2021)   Hunger Vital Sign    Worried About Running Out of Food in the Last Year: Never true    Ran Out of Food in the Last Year: Never true  Transportation Needs: No Transportation Needs (11/04/2021)   PRAPARE - Administrator, Civil Service (Medical): No    Lack of Transportation (Non-Medical): No  Physical Activity: Insufficiently Active (11/04/2021)   Exercise Vital Sign    Days  of Exercise per Week: 5 days    Minutes of Exercise per Session: 10 min  Stress: No Stress Concern Present (11/04/2021)   [employer] of Occupational Health - Occupational Stress Questionnaire    Feeling of Stress : Not at all  Social Connections: Moderately Integrated (11/04/2021)   Social Connection and Isolation Panel [NHANES]    Frequency of Communication with Friends and Family: More than three times a week    Frequency of Social Gatherings with Friends and Family: Once a week    Attends Religious Services: 1 to 4 times per year    Active Member of [employer] or Organizations: No    Attends Banker Meetings: Never    Marital Status: Married   Past Surgical History:  Procedure Laterality Date   BACK SURGERY     remote, no history for me to review.   PTCA     Past Surgical History:  Procedure Laterality Date   BACK SURGERY     remote, no history for me to review.   PTCA     Past Medical History:  Diagnosis Date   Arthritis    CAD (coronary artery disease)    COPD (chronic obstructive pulmonary disease) (HCC)    Dyslipidemia    Fibromyalgia    GERD (gastroesophageal reflux disease)  HTN (hypertension)    Palpitations    Rheumatoid arthritis (HCC)    Thyroid disease    BP (!) 154/79   Pulse 66   Ht 5' (1.524 m)   Wt 136 lb (61.7 kg)   SpO2 97%   BMI 26.56 kg/m   Opioid Risk Score:   Fall Risk Score:  `1  Depression screen PHQ 2/9     08/28/2022    1:33 PM 05/19/2022    2:21 PM 03/27/2022    3:33 PM 03/03/2022   11:26 AM 12/06/2021    9:40 AM 11/04/2021    2:13 PM 09/27/2021    1:56 PM  Depression screen PHQ 2/9  Decreased Interest 1 0 0 3 0 1 3  Down, Depressed, Hopeless 0 0 0 3 1 1 3   PHQ - 2 Score 1 0 0 6 1 2 6   Altered sleeping  0 0 3  0   Tired, decreased energy  0 0 3  0   Change in appetite  0 0 0  0   Feeling bad or failure about yourself   0 0 2  0   Trouble concentrating  0 0 1  0   Moving slowly or fidgety/restless  0 0 0      Suicidal thoughts  0 0 0  0   PHQ-9 Score  0 0 15  2   Difficult doing work/chores  Not difficult at all Not difficult at all Very difficult  Not difficult at all

## 2022-09-01 LAB — TOXASSURE SELECT,+ANTIDEPR,UR

## 2022-09-06 ENCOUNTER — Telehealth: Payer: Self-pay | Admitting: *Deleted

## 2022-09-06 NOTE — Telephone Encounter (Signed)
Urine drug screen for this encounter is consistent for prescribed medication 

## 2022-09-21 ENCOUNTER — Other Ambulatory Visit: Payer: Self-pay | Admitting: *Deleted

## 2022-09-21 MED ORDER — OXYCODONE-ACETAMINOPHEN 10-325 MG PO TABS: 2.0000 | ORAL_TABLET | Freq: Four times a day (QID) | ORAL | 0 refills | Status: DC | PRN

## 2022-10-19 ENCOUNTER — Other Ambulatory Visit: Payer: Self-pay | Admitting: Physical Medicine and Rehabilitation

## 2022-10-19 ENCOUNTER — Telehealth: Payer: Self-pay | Admitting: *Deleted

## 2022-10-19 MED ORDER — OXYCODONE-ACETAMINOPHEN 10-325 MG PO TABS: 2.0000 | ORAL_TABLET | Freq: Four times a day (QID) | ORAL | 0 refills | Status: DC | PRN

## 2022-10-19 NOTE — Telephone Encounter (Signed)
Patient requesting refill on Oxycodone. Last refill: 09/21/22

## 2022-10-24 ENCOUNTER — Encounter: Payer: PPO | Attending: Registered Nurse | Admitting: Physical Medicine and Rehabilitation

## 2022-10-24 ENCOUNTER — Other Ambulatory Visit: Payer: Self-pay | Admitting: Physical Medicine and Rehabilitation

## 2022-10-24 VITALS — BP 125/83 | HR 66 | Ht 60.0 in | Wt 134.0 lb

## 2022-10-24 DIAGNOSIS — Z730 Burn-out: Secondary | ICD-10-CM

## 2022-10-24 DIAGNOSIS — M961 Postlaminectomy syndrome, not elsewhere classified: Secondary | ICD-10-CM

## 2022-10-24 DIAGNOSIS — R110 Nausea: Secondary | ICD-10-CM

## 2022-10-24 MED ORDER — SCOPOLAMINE 1 MG/3DAYS TD PT72: 1.0000 | MEDICATED_PATCH | TRANSDERMAL | 12 refills | Status: DC

## 2022-10-24 MED ORDER — OXYCODONE-ACETAMINOPHEN 10-325 MG PO TABS: 2.0000 | ORAL_TABLET | Freq: Four times a day (QID) | ORAL | 0 refills | Status: DC | PRN

## 2022-10-24 NOTE — Addendum Note (Signed)
Addended by: Horton Chin on: 10/24/2022 02:29 PM   Modules accepted: Orders
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
## 2022-11-17 ENCOUNTER — Telehealth: Payer: Self-pay | Admitting: Physical Medicine and Rehabilitation

## 2022-11-17 MED ORDER — OXYCODONE-ACETAMINOPHEN 10-325 MG PO TABS: 2.0000 | ORAL_TABLET | Freq: Four times a day (QID) | ORAL | 0 refills | Status: DC | PRN

## 2022-11-17 NOTE — Telephone Encounter (Signed)
Patient called requesting refill on oxycodone to be sent to [facility].

## 2022-11-17 NOTE — Telephone Encounter (Signed)
PMP was Reviewed.  Oxycodone was e-scribed to Pharmacy.  Call placed to Jasmin [name] regarding the above. She verbalizes understanding.

## 2022-11-21 ENCOUNTER — Ambulatory Visit (INDEPENDENT_AMBULATORY_CARE_PROVIDER_SITE_OTHER): Payer: PPO | Admitting: Family Medicine

## 2022-11-21 ENCOUNTER — Encounter: Payer: Self-pay | Admitting: Family Medicine

## 2022-11-21 VITALS — BP 120/68 | HR 63 | Temp 98.3°F | Ht 60.0 in | Wt 136.0 lb

## 2022-11-21 DIAGNOSIS — R0609 Other forms of dyspnea: Secondary | ICD-10-CM

## 2022-11-21 DIAGNOSIS — M75101 Unspecified rotator cuff tear or rupture of right shoulder, not specified as traumatic: Secondary | ICD-10-CM

## 2022-11-21 MED ORDER — FUROSEMIDE 40 MG PO TABS: 40.0000 mg | ORAL_TABLET | Freq: Every day | ORAL | 3 refills | Status: DC | PRN

## 2022-11-21 MED ORDER — POTASSIUM CHLORIDE CRYS ER 20 MEQ PO TBCR: 20.0000 meq | EXTENDED_RELEASE_TABLET | Freq: Every day | ORAL | 3 refills | Status: DC | PRN
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
## 2022-12-15 ENCOUNTER — Telehealth: Payer: Self-pay | Admitting: Physical Medicine and Rehabilitation

## 2022-12-15 MED ORDER — OXYCODONE-ACETAMINOPHEN 10-325 MG PO TABS: 2.0000 | ORAL_TABLET | Freq: Four times a day (QID) | ORAL | 0 refills | Status: DC | PRN

## 2022-12-15 NOTE — Telephone Encounter (Signed)
Requesting refill for oxycodone. Send to [facility]

## 2022-12-15 NOTE — Telephone Encounter (Signed)
PMP was Reviewed,  Oxycodone e-scribed to pharmacy.  She has a scheduled appointment with Dr. Carlis Abbott this month.

## 2022-12-26 ENCOUNTER — Encounter: Payer: PPO | Admitting: Physical Medicine and Rehabilitation

## 2022-12-27 ENCOUNTER — Other Ambulatory Visit: Payer: Self-pay | Admitting: Family Medicine

## 2022-12-29 NOTE — Telephone Encounter (Signed)
Unable to refill per protocol, Rx request is too soon. Last refill 03/27/22 for 90 and 3 refills. OV needed for additional refills.  Requested Prescriptions  Pending Prescriptions Disp Refills   metoprolol succinate (TOPROL-XL) 50 MG 24 hr tablet [Pharmacy Med Name: METOPROLOL SUCC ER 50 MG TAB] 90 tablet 3    Sig: TAKE 1 TABLET BY MOUTH DAILY. TAKE WITH OR IMMEDIATELY FOLLOWING A MEAL.     Cardiovascular:  Beta Blockers Failed - 12/27/2022  6:10 PM      Failed - Valid encounter within last 6 months    Recent Outpatient Visits           1 year ago Hypothyroidism, unspecified type   [facility], Priscille Heidelberg, MD              Passed - Last BP in normal range    BP Readings from Last 1 Encounters:  11/21/22 120/68         Passed - Last Heart Rate in normal range    Pulse Readings from Last 1 Encounters:  11/21/22 63

## 2023-01-11 ENCOUNTER — Other Ambulatory Visit: Payer: Self-pay

## 2023-01-11 NOTE — Telephone Encounter (Signed)
Need refill 

## 2023-01-12 MED ORDER — OXYCODONE-ACETAMINOPHEN 10-325 MG PO TABS: 2.0000 | ORAL_TABLET | Freq: Four times a day (QID) | ORAL | 0 refills | Status: DC | PRN

## 2023-01-31 ENCOUNTER — Other Ambulatory Visit: Payer: Self-pay | Admitting: Family Medicine

## 2023-01-31 ENCOUNTER — Other Ambulatory Visit: Payer: Self-pay | Admitting: Physical Medicine and Rehabilitation

## 2023-02-01 NOTE — Telephone Encounter (Signed)
Requested Prescriptions  Pending Prescriptions Disp Refills   metoprolol succinate (TOPROL-XL) 50 MG 24 hr tablet [Pharmacy Med Name: METOPROLOL SUCC ER 50 MG TAB] 90 tablet 0    Sig: TAKE 1 TABLET BY MOUTH DAILY. TAKE WITH OR IMMEDIATELY FOLLOWING A MEAL.     Cardiovascular:  Beta Blockers Failed - 01/31/2023  6:11 PM      Failed - Valid encounter within last 6 months    Recent Outpatient Visits           1 year ago Hypothyroidism, unspecified type   [facility] Pickard, Priscille Heidelberg, MD              Passed - Last BP in normal range    BP Readings from Last 1 Encounters:  11/21/22 120/68         Passed - Last Heart Rate in normal range    Pulse Readings from Last 1 Encounters:  11/21/22 63

## 2023-02-02 ENCOUNTER — Encounter: Payer: PPO | Attending: Registered Nurse | Admitting: Physical Medicine and Rehabilitation

## 2023-02-02 VITALS — BP 167/83 | HR 61 | Ht 60.0 in | Wt 134.0 lb

## 2023-02-02 DIAGNOSIS — M961 Postlaminectomy syndrome, not elsewhere classified: Secondary | ICD-10-CM | POA: Insufficient documentation

## 2023-02-02 DIAGNOSIS — G6289 Other specified polyneuropathies: Secondary | ICD-10-CM | POA: Diagnosis not present

## 2023-02-02 DIAGNOSIS — I10 Essential (primary) hypertension: Secondary | ICD-10-CM | POA: Diagnosis not present

## 2023-02-02 MED ORDER — OXYCODONE-ACETAMINOPHEN 10-325 MG PO TABS: 2.0000 | ORAL_TABLET | Freq: Four times a day (QID) | ORAL | 0 refills | Status: DC | PRN

## 2023-02-02 NOTE — Progress Notes (Signed)
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 be increased due to pain associated with administration of the patch.   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

## 2023-02-08 ENCOUNTER — Telehealth: Payer: Self-pay

## 2023-02-08 DIAGNOSIS — G6289 Other specified polyneuropathies: Secondary | ICD-10-CM

## 2023-02-08 MED ORDER — QUTENZA (4 PATCH) 8 % EX KIT
4.0000 | PACK | Freq: Once | CUTANEOUS | 0 refills | Status: AC
Start: 2023-04-16 — End: 2023-04-16

## 2023-02-08 NOTE — Telephone Encounter (Signed)
RX Sent to pharmacy and PA submitted through cover my meds Jasmin [name] (Key: [facility])

## 2023-02-09 ENCOUNTER — Telehealth: Payer: Self-pay

## 2023-02-09 ENCOUNTER — Other Ambulatory Visit: Payer: Self-pay | Admitting: Physical Medicine and Rehabilitation

## 2023-02-09 MED ORDER — OXYCODONE-ACETAMINOPHEN 10-325 MG PO TABS
2.0000 | ORAL_TABLET | Freq: Four times a day (QID) | ORAL | 0 refills | Status: DC | PRN
Start: 2023-02-09 — End: 2023-04-09

## 2023-02-09 NOTE — Telephone Encounter (Signed)
Patient requesting refill for oxycodone per pmp   Filled  Written  ID  Drug  QTY  Days  Prescriber  RX #  Dispenser  Refill  Daily Dose*  Pymt Type  PMP  01/13/2023 01/12/2023 1  Oxycodone-Acetaminophen 10-325 240.00 [address] [id number] Nor ([id number]) 0/0 120.00 MME Private Pay [place]

## 2023-02-23 ENCOUNTER — Other Ambulatory Visit: Payer: Self-pay | Admitting: Family Medicine

## 2023-02-23 NOTE — Telephone Encounter (Signed)
Requested Prescriptions  Pending Prescriptions Disp Refills   KLOR-CON M20 20 MEQ tablet [Pharmacy Med Name: KLOR-CON M20 TABLET] 90 tablet 1    Sig: TAKE 1 TABLET (20 MEQ TOTAL) BY MOUTH DAILY AS NEEDED (WITH FUROSEMIDE).     Endocrinology:  Minerals - Potassium Supplementation Failed - 02/23/2023  1:32 AM      Failed - Valid encounter within last 12 months    Recent Outpatient Visits           1 year ago Hypothyroidism, unspecified type   [facility] Pickard, Priscille Heidelberg, MD              Passed - K in normal range and within 360 days    Potassium  Date Value Ref Range Status  04/17/2022 5.2 3.5 - 5.2 mmol/L Final         Passed - Cr in normal range and within 360 days    Creat  Date Value Ref Range Status  03/27/2022 0.69 0.60 - 1.00 mg/dL Final   Creatinine, Ser  Date Value Ref Range Status  04/17/2022 0.64 0.57 - 1.00 mg/dL Final          furosemide (LASIX) 40 MG tablet [Pharmacy Med Name: FUROSEMIDE 40 MG TABLET] 90 tablet 1    Sig: TAKE 1 TABLET (40 MG TOTAL) BY MOUTH DAILY AS NEEDED FOR FLUID.     Cardiovascular:  Diuretics - Loop Failed - 02/23/2023  1:32 AM      Failed - K in normal range and within 180 days    Potassium  Date Value Ref Range Status  04/17/2022 5.2 3.5 - 5.2 mmol/L Final         Failed - Ca in normal range and within 180 days    Calcium  Date Value Ref Range Status  04/17/2022 9.6 8.7 - 10.3 mg/dL Final         Failed - Na in normal range and within 180 days    Sodium  Date Value Ref Range Status  04/17/2022 138 134 - 144 mmol/L Final         Failed - Cr in normal range and within 180 days    Creat  Date Value Ref Range Status  03/27/2022 0.69 0.60 - 1.00 mg/dL Final   Creatinine, Ser  Date Value Ref Range Status  04/17/2022 0.64 0.57 - 1.00 mg/dL Final         Failed - Cl in normal range and within 180 days    Chloride  Date Value Ref Range Status  04/17/2022 100 96 - 106 mmol/L Final         Failed -  Mg Level in normal range and within 180 days    No results found for: "MG"       Failed - Last BP in normal range    BP Readings from Last 1 Encounters:  02/02/23 (!) 167/83         Failed - Valid encounter within last 6 months    Recent Outpatient Visits           1 year ago Hypothyroidism, unspecified type   [facility] Pickard, Priscille Heidelberg, MD

## 2023-03-09 ENCOUNTER — Other Ambulatory Visit: Payer: Self-pay

## 2023-04-01 ENCOUNTER — Other Ambulatory Visit: Payer: Self-pay | Admitting: Physical Medicine and Rehabilitation

## 2023-04-09 ENCOUNTER — Telehealth: Payer: Self-pay

## 2023-04-09 MED ORDER — OXYCODONE-ACETAMINOPHEN 10-325 MG PO TABS: 2.0000 | ORAL_TABLET | Freq: Four times a day (QID) | ORAL | 0 refills | Status: DC | PRN

## 2023-04-09 NOTE — Telephone Encounter (Signed)
PMP was Reviewed.  UDS was Reviewed.  Oxycodone e-scribed to pharmacy. She has a scheduled appointment with Dr Marijean Niemann in December.

## 2023-04-10 ENCOUNTER — Other Ambulatory Visit: Payer: Self-pay | Admitting: Family Medicine

## 2023-04-11 NOTE — Telephone Encounter (Signed)
Requested medication (s) are due for refill today -yes  Requested medication (s) are on the active medication list -yes  Future visit scheduled -no  Last refill: 03/27/22 #180 3RF  Notes to clinic: fails lab protocol- over 1 year- 09/15/21  Requested Prescriptions  Pending Prescriptions Disp Refills   liothyronine (CYTOMEL) 5 MCG tablet [Pharmacy Med Name: LIOTHYRONINE SOD 5 MCG TAB] 180 tablet 3    Sig: Take one tablet by mouth twice a day on an empty stomach.     Endocrinology:  Hypothyroid Agents Failed - 04/10/2023  2:16 PM      Failed - TSH in normal range and within 360 days    TSH  Date Value Ref Range Status  09/15/2021 1.26 0.40 - 4.50 mIU/L Final         Failed - Valid encounter within last 12 months    Recent Outpatient Visits           1 year ago Hypothyroidism, unspecified type   [facility] Donita Brooks, MD                 Requested Prescriptions  Pending Prescriptions Disp Refills   liothyronine (CYTOMEL) 5 MCG tablet [Pharmacy Med Name: LIOTHYRONINE SOD 5 MCG TAB] 180 tablet 3    Sig: Take one tablet by mouth twice a day on an empty stomach.     Endocrinology:  Hypothyroid Agents Failed - 04/10/2023  2:16 PM      Failed - TSH in normal range and within 360 days    TSH  Date Value Ref Range Status  09/15/2021 1.26 0.40 - 4.50 mIU/L Final         Failed - Valid encounter within last 12 months    Recent Outpatient Visits           1 year ago Hypothyroidism, unspecified type   [facility] Pickard, Priscille Heidelberg, MD

## 2023-04-16 ENCOUNTER — Encounter: Payer: Self-pay | Admitting: Physical Medicine and Rehabilitation

## 2023-04-16 ENCOUNTER — Encounter: Payer: PPO | Attending: Registered Nurse | Admitting: Physical Medicine and Rehabilitation

## 2023-04-16 VITALS — BP 116/75 | HR 69 | Ht 60.0 in | Wt 132.4 lb

## 2023-04-16 DIAGNOSIS — M961 Postlaminectomy syndrome, not elsewhere classified: Secondary | ICD-10-CM | POA: Insufficient documentation

## 2023-04-16 DIAGNOSIS — G6289 Other specified polyneuropathies: Secondary | ICD-10-CM | POA: Diagnosis not present

## 2023-04-16 DIAGNOSIS — Z730 Burn-out: Secondary | ICD-10-CM | POA: Diagnosis not present

## 2023-04-16 NOTE — Addendum Note (Signed)
Addended by: Horton Chin on: 04/16/2023 01:38 PM   Modules accepted: Level of Service
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
## 2023-05-03 ENCOUNTER — Other Ambulatory Visit: Payer: Self-pay

## 2023-05-03 MED ORDER — METOPROLOL SUCCINATE ER 50 MG PO TB24: 50.0000 mg | ORAL_TABLET | Freq: Every day | ORAL | 0 refills | Status: DC

## 2023-05-03 NOTE — Telephone Encounter (Signed)
Amlodipine was d/c on 05/19/22, patient reported not taking.

## 2023-05-03 NOTE — Telephone Encounter (Signed)
Requested Prescriptions  Pending Prescriptions Disp Refills   metoprolol succinate (TOPROL-XL) 50 MG 24 hr tablet 90 tablet 0    Sig: Take 1 tablet (50 mg total) by mouth daily. TAKE WITH OR IMMEDIATELY FOLLOWING A MEAL.     Cardiovascular:  Beta Blockers Failed - 05/03/2023  2:07 PM      Failed - Valid encounter within last 6 months    Recent Outpatient Visits           1 year ago Hypothyroidism, unspecified type   [facility] Pickard, Priscille Heidelberg, MD              Passed - Last BP in normal range    BP Readings from Last 1 Encounters:  04/16/23 116/75         Passed - Last Heart Rate in normal range    Pulse Readings from Last 1 Encounters:  04/16/23 69

## 2023-05-03 NOTE — Telephone Encounter (Signed)
Prescription Request  05/03/2023  LOV: 11/21/22  What is the name of the medication or equipment? metoprolol succinate (TOPROL-XL) 50 MG 24 hr tablet [[id number]]  Have you contacted your pharmacy to request a refill? Yes   Which pharmacy would you like this sent to?  [id number], [place] - [address] [place] [address] Phone: [phone] Fax: [phone]    Patient notified that their request is being sent to the clinical staff for review and that they should receive a response within 2 business days.   Please advise at [facility] [phone]  Prescription Request  05/03/2023  LOV:11/21/22  What is the name of the medication or equipment? amLODipine (NORVASC) 10 MG tablet [[id number]]  DISCONTINUED   Have you contacted your pharmacy to request a refill? Yes   Which pharmacy would you like this sent to?  [id number], [place] - [address] [place] [address] Phone: [phone] Fax: [phone]    Patient notified that their request is being sent to the clinical staff for review and that they should receive a response within 2 business days.   Please advise at [facility] [phone]

## 2023-05-04 ENCOUNTER — Telehealth: Payer: Self-pay | Admitting: *Deleted

## 2023-05-04 ENCOUNTER — Other Ambulatory Visit: Payer: Self-pay | Admitting: Physical Medicine and Rehabilitation

## 2023-05-04 MED ORDER — OXYCODONE-ACETAMINOPHEN 10-325 MG PO TABS: 2.0000 | ORAL_TABLET | Freq: Four times a day (QID) | ORAL | 0 refills | Status: DC | PRN

## 2023-05-04 NOTE — Telephone Encounter (Signed)
Jasmin [name] called and is requesting refills on her hydrocodone. Per PMP she filled last 04/09/23.

## 2023-05-07 ENCOUNTER — Telehealth: Payer: Self-pay

## 2023-05-07 NOTE — Telephone Encounter (Signed)
Left message that medication had been filled.

## 2023-05-07 NOTE — Telephone Encounter (Signed)
Prescription Request  05/07/2023  LOV: 11/21/22  What is the name of the medication or equipment? amLODipine (NORVASC) 10 MG tablet [[id number]]  DISCONTINUED   Have you contacted your pharmacy to request a refill? Yes   Which pharmacy would you like this sent to?  [id number], [place] - [address] [place] [address] Phone: [phone] Fax: [phone]    Patient notified that their request is being sent to the clinical staff for review and that they should receive a response within 2 business days.   Please advise at [facility] [phone]

## 2023-05-21 ENCOUNTER — Encounter: Payer: Self-pay | Admitting: Family Medicine

## 2023-05-21 ENCOUNTER — Ambulatory Visit: Payer: PPO | Admitting: Family Medicine

## 2023-05-21 VITALS — BP 132/92 | HR 67 | Temp 98.5°F | Ht 60.0 in | Wt 133.0 lb

## 2023-05-21 DIAGNOSIS — R110 Nausea: Secondary | ICD-10-CM

## 2023-05-21 DIAGNOSIS — R1013 Epigastric pain: Secondary | ICD-10-CM | POA: Diagnosis not present

## 2023-05-21 DIAGNOSIS — K529 Noninfective gastroenteritis and colitis, unspecified: Secondary | ICD-10-CM | POA: Diagnosis not present

## 2023-05-21 MED ORDER — GABAPENTIN 100 MG PO CAPS: 100.0000 mg | ORAL_CAPSULE | Freq: Three times a day (TID) | ORAL | 3 refills | Status: DC
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
## 2023-05-23 ENCOUNTER — Telehealth: Payer: Self-pay

## 2023-05-23 DIAGNOSIS — G6289 Other specified polyneuropathies: Secondary | ICD-10-CM

## 2023-05-23 MED ORDER — QUTENZA (4 PATCH) 8 % EX KIT: 4.0000 | PACK | Freq: Once | CUTANEOUS | 0 refills | Status: AC

## 2023-05-23 MED ORDER — QUTENZA (4 PATCH) 8 % EX KIT: 4.0000 | PACK | Freq: Once | CUTANEOUS | 0 refills | Status: DC

## 2023-05-23 NOTE — Telephone Encounter (Signed)
Qutenza patches sent to specialty pharmacy

## 2023-05-25 ENCOUNTER — Other Ambulatory Visit: Payer: Self-pay | Admitting: Family Medicine

## 2023-05-25 NOTE — Telephone Encounter (Signed)
 Copied from CRM [id number]. Topic: Clinical - Medication Refill >> May 25, 2023  2:47 PM Bascom RAMAN wrote: Most Recent Primary Care Visit:  Provider: DUANNE LOWERS T  Department: [facility]  Visit Type: OFFICE VISIT  Date: 05/21/2023  Medication: liothyronine  (CYTOMEL ) 5 MCG tablet 90 day supply  Has the patient contacted their pharmacy? Yes (Agent: If no, request that the patient contact the pharmacy for the refill. If patient does not wish to contact the pharmacy document the reason why and proceed with request.) (Agent: If yes, when and what did the pharmacy advise?)  Is this the correct pharmacy for this prescription? Yes If no, delete pharmacy and type the correct one.  This is the patient's preferred pharmacy:  [phone] [place] - [address] [place] [address] [place] [address] Phone: [phone] Fax: [phone]   Has the prescription been filled recently? No  Is the patient out of the medication? Yes  Has the patient been seen for an appointment in the last year OR does the patient have an upcoming appointment? Yes  Can we respond through MyChart? No  Agent: Please be advised that Rx refills may take up to 3 business days. We ask that you follow-up with your pharmacy.

## 2023-05-28 MED ORDER — LIOTHYRONINE SODIUM 5 MCG PO TABS: ORAL_TABLET | ORAL | 3 refills | Status: DC

## 2023-05-29 ENCOUNTER — Encounter: Payer: Self-pay | Admitting: Family Medicine

## 2023-05-29 ENCOUNTER — Other Ambulatory Visit: Payer: Self-pay | Admitting: Family Medicine

## 2023-05-29 MED ORDER — ZENPEP 40000-126000 UNITS PO CPEP: 1.0000 | ORAL_CAPSULE | Freq: Three times a day (TID) | ORAL | 4 refills | Status: DC

## 2023-06-07 ENCOUNTER — Telehealth: Payer: Self-pay

## 2023-06-07 MED ORDER — OXYCODONE-ACETAMINOPHEN 10-325 MG PO TABS: 2.0000 | ORAL_TABLET | Freq: Four times a day (QID) | ORAL | 0 refills | Status: DC | PRN

## 2023-06-07 NOTE — Telephone Encounter (Signed)
Please review Dr. Carlis Abbott is not in the office this week.  Request for Oxycodone 10-325 MG  Filled  Written  ID  Drug  QTY  Days  Prescriber  RX #  Dispenser  Refill  Daily Dose*  Pymt Type  PMP  05/21/2023 05/21/2023 1  Gabapentin 100 Mg Capsule 90.00 30 Wa Pic [id number] Nor ([id number]) 0/3  Private Pay [place] 05/10/2023 05/04/2023 1  Oxycodone-Acetaminophen 10-325 240.00 30 Kr Rau [id number] Nor ([id number]) 0/0 120.00 MME Private Pay [place] 04/09/2023 04/09/2023 1  Oxycodone-Acetaminophen 10-325 240.00 30 Eu Tho [id number] Nor ([id number]) 0/0 120.00 MME Private Pay [place]

## 2023-06-07 NOTE — Telephone Encounter (Signed)
PMP was Reviewed.  Oxycodone e-scribed to pharmacy. Ms. Bouvia has a scheduled appointment with Dr Carlis Abbott next month.

## 2023-06-14 ENCOUNTER — Telehealth: Payer: Self-pay

## 2023-06-14 NOTE — Telephone Encounter (Signed)
PA submitted for Qutenza patches  Fanny Skates (Key: Roxanne Mins)

## 2023-06-15 ENCOUNTER — Telehealth: Payer: Self-pay

## 2023-06-15 NOTE — Telephone Encounter (Signed)
My chart message sent regarding qutenza patches delivery

## 2023-06-18 ENCOUNTER — Encounter: Payer: PPO | Admitting: Physical Medicine and Rehabilitation

## 2023-07-02 ENCOUNTER — Other Ambulatory Visit: Payer: Self-pay | Admitting: Physical Medicine and Rehabilitation

## 2023-07-05 ENCOUNTER — Telehealth: Payer: Self-pay | Admitting: *Deleted

## 2023-07-05 NOTE — Telephone Encounter (Signed)
 Mrs Piche called for a refill on her oxycodone acetaminophen 10/325.

## 2023-07-06 ENCOUNTER — Other Ambulatory Visit: Payer: Self-pay | Admitting: Physical Medicine and Rehabilitation

## 2023-07-06 MED ORDER — OXYCODONE-ACETAMINOPHEN 10-325 MG PO TABS: 2.0000 | ORAL_TABLET | Freq: Four times a day (QID) | ORAL | 0 refills | Status: DC | PRN

## 2023-07-23 ENCOUNTER — Telehealth: Payer: Self-pay

## 2023-07-23 DIAGNOSIS — G6289 Other specified polyneuropathies: Secondary | ICD-10-CM

## 2023-07-23 MED ORDER — QUTENZA (4 PATCH) 8 % EX KIT: 4.0000 | PACK | Freq: Once | CUTANEOUS | 0 refills | Status: AC

## 2023-07-23 NOTE — Telephone Encounter (Signed)
refill 

## 2023-07-24 ENCOUNTER — Encounter: Payer: Self-pay | Admitting: Physical Medicine and Rehabilitation

## 2023-07-24 ENCOUNTER — Encounter: Payer: PPO | Attending: Registered Nurse | Admitting: Physical Medicine and Rehabilitation

## 2023-07-24 ENCOUNTER — Telehealth: Payer: Self-pay | Admitting: Family Medicine

## 2023-07-24 ENCOUNTER — Other Ambulatory Visit: Payer: Self-pay

## 2023-07-24 VITALS — BP 142/79 | HR 63 | Ht 60.0 in | Wt 127.0 lb

## 2023-07-24 DIAGNOSIS — R634 Abnormal weight loss: Secondary | ICD-10-CM | POA: Insufficient documentation

## 2023-07-24 DIAGNOSIS — S43421S Sprain of right rotator cuff capsule, sequela: Secondary | ICD-10-CM | POA: Insufficient documentation

## 2023-07-24 DIAGNOSIS — G6289 Other specified polyneuropathies: Secondary | ICD-10-CM | POA: Insufficient documentation

## 2023-07-24 DIAGNOSIS — R109 Unspecified abdominal pain: Secondary | ICD-10-CM | POA: Diagnosis not present

## 2023-07-24 MED ORDER — METOPROLOL SUCCINATE ER 50 MG PO TB24: 50.0000 mg | ORAL_TABLET | Freq: Every day | ORAL | 0 refills | Status: DC

## 2023-07-24 MED ORDER — OXYCODONE-ACETAMINOPHEN 10-325 MG PO TABS: 2.0000 | ORAL_TABLET | Freq: Four times a day (QID) | ORAL | 0 refills | Status: DC | PRN

## 2023-07-24 NOTE — Telephone Encounter (Signed)
 Prescription Request  07/24/2023  LOV: 05/21/2023  What is the name of the medication or equipment?   metoprolol succinate (TOPROL-XL) 50 MG 24 hr tablet   Have you contacted your pharmacy to request a refill? Yes   Which pharmacy would you like this sent to?  [id number], [place] - [address] [place] [address] Phone: [phone] Fax: [phone]    Patient notified that their request is being sent to the clinical staff for review and that they should receive a response within 2 business days.   Please advise pharmacist.

## 2023-07-24 NOTE — Progress Notes (Addendum)
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 therapy  -reviewed sensory testing results  -discussed that Qutenza copay of $250 was too expensive for her- will try again for next time as could be covered if she meets her deductible  -Discussed Qutenza as an option for neuropathic pain control. Discussed that this is a capsaicin patch, stronger than capsaicin cream. Discussed that it is currently approved for diabetic peripheral neuropathy and post-herpetic neuralgia, but that it has also shown benefit in treating other forms of neuropathy. Provided patient with link to site to learn more about the patch: https://www.clark.biz/. Discussed that the patch would be placed in office and benefits usually last 3 months. Discussed that unintended exposure to capsaicin can cause severe irritation of eyes, mucous  membranes, respiratory tract, and skin, but that Qutenza is a local treatment and does not have the systemic side effects of other nerve medications. Discussed that there may be pain, itching, erythema, and decreased sensory function associated with the application of Qutenza. Side effects usually subside within 1 week. A cold pack of analgesic medications can help with these side effects. Blood pressure can also be increased due to pain associated with administration of the patch.   14. Weight loss/abdominal pain: -continue pancrelipase

## 2023-08-30 ENCOUNTER — Telehealth: Payer: Self-pay | Admitting: Physical Medicine and Rehabilitation

## 2023-08-30 NOTE — Telephone Encounter (Signed)
 Patient [facility] [place]  Please call patient [phone].

## 2023-08-31 ENCOUNTER — Other Ambulatory Visit: Payer: Self-pay | Admitting: Physical Medicine and Rehabilitation

## 2023-08-31 MED ORDER — OXYCODONE-ACETAMINOPHEN 10-325 MG PO TABS
2.0000 | ORAL_TABLET | Freq: Four times a day (QID) | ORAL | 0 refills | Status: DC | PRN
Start: 2023-08-31 — End: 2023-09-26

## 2023-09-25 ENCOUNTER — Telehealth: Payer: Self-pay

## 2023-09-25 NOTE — Telephone Encounter (Signed)
 Pt. Need a refill on oxycodone , [facility].

## 2023-09-26 ENCOUNTER — Other Ambulatory Visit: Payer: Self-pay | Admitting: Physical Medicine and Rehabilitation

## 2023-09-26 MED ORDER — OXYCODONE-ACETAMINOPHEN 10-325 MG PO TABS: 2.0000 | ORAL_TABLET | Freq: Four times a day (QID) | ORAL | 0 refills | Status: DC | PRN

## 2023-09-28 NOTE — Telephone Encounter (Signed)
 Called pt. to let her know that her prescription was ready for pick up.

## 2023-10-02 ENCOUNTER — Other Ambulatory Visit: Payer: Self-pay

## 2023-10-02 MED ORDER — CLOPIDOGREL BISULFATE 75 MG PO TABS: 75.0000 mg | ORAL_TABLET | Freq: Every day | ORAL | 3 refills | Status: AC

## 2023-10-09 ENCOUNTER — Encounter: Admitting: Physical Medicine and Rehabilitation

## 2023-10-22 ENCOUNTER — Telehealth: Payer: Self-pay

## 2023-10-22 ENCOUNTER — Other Ambulatory Visit: Payer: Self-pay

## 2023-10-22 MED ORDER — METOPROLOL SUCCINATE ER 50 MG PO TB24: 50.0000 mg | ORAL_TABLET | Freq: Every day | ORAL | 0 refills | Status: DC

## 2023-10-22 NOTE — Telephone Encounter (Signed)
 Prescription Request  10/22/2023  LOV: 05/21/23  What is the name of the medication or equipment? metoprolol  succinate (TOPROL -XL) 50 MG 24 hr tablet [[id number]]   Have you contacted your pharmacy to request a refill? Yes   Which pharmacy would you like this sent to?  [id number], [place] - [address] [place] [address] Phone: [phone] Fax: [phone]    Patient notified that their request is being sent to the clinical staff for review and that they should receive a response within 2 business days.   Please advise at [facility] [phone]

## 2023-10-23 ENCOUNTER — Other Ambulatory Visit: Payer: Self-pay

## 2023-10-24 MED ORDER — PREDNISONE 1 MG PO TABS: 1.0000 mg | ORAL_TABLET | Freq: Every day | ORAL | 3 refills | Status: DC | PRN

## 2023-10-31 ENCOUNTER — Telehealth: Payer: Self-pay

## 2023-10-31 NOTE — Telephone Encounter (Signed)
 Pt requested a refill on her Oxycodone . She has no refills and her next appointment is 12/11/23 please advise.

## 2023-11-02 ENCOUNTER — Encounter: Payer: Self-pay | Admitting: Registered Nurse

## 2023-11-02 ENCOUNTER — Encounter: Attending: Registered Nurse | Admitting: Registered Nurse

## 2023-11-02 VITALS — BP 147/82 | HR 66 | Ht 60.0 in | Wt 128.4 lb

## 2023-11-02 DIAGNOSIS — M2550 Pain in unspecified joint: Secondary | ICD-10-CM | POA: Insufficient documentation

## 2023-11-02 DIAGNOSIS — Z5181 Encounter for therapeutic drug level monitoring: Secondary | ICD-10-CM | POA: Insufficient documentation

## 2023-11-02 DIAGNOSIS — M961 Postlaminectomy syndrome, not elsewhere classified: Secondary | ICD-10-CM | POA: Insufficient documentation

## 2023-11-02 DIAGNOSIS — G894 Chronic pain syndrome: Secondary | ICD-10-CM | POA: Diagnosis not present

## 2023-11-02 DIAGNOSIS — S43421S Sprain of right rotator cuff capsule, sequela: Secondary | ICD-10-CM | POA: Insufficient documentation

## 2023-11-02 DIAGNOSIS — G6289 Other specified polyneuropathies: Secondary | ICD-10-CM | POA: Diagnosis not present

## 2023-11-02 DIAGNOSIS — Z79891 Long term (current) use of opiate analgesic: Secondary | ICD-10-CM | POA: Diagnosis not present

## 2023-11-02 MED ORDER — OXYCODONE-ACETAMINOPHEN 10-325 MG PO TABS: 2.0000 | ORAL_TABLET | Freq: Four times a day (QID) | ORAL | 0 refills | Status: DC | PRN

## 2023-11-02 NOTE — Progress Notes (Signed)
 Subjective:    Patient ID: Jasmin [name], female    DOB: 1946-03-10, 78 y.o.   MRN: [id number]  HPI: Jasmin [name] is a 78 y.o. female who returns for follow up appointment for chronic pain and medication refill. She states her pain is located in her right shoulder, lower back, bilateral legs and bilateral feet with tingling and burning. She also reports bilateral hip pain and generalized joint pain. She rates her pain 8. Her current exercise regime is walking and performing stretching exercises.  Ms. Toutant Morphine equivalent is 120.00 MME.   UDS ordered today.    Pain Inventory Average Pain 9 Pain Right Now 8 My pain is constant, sharp, burning, dull, stabbing, tingling, and aching  In the last 24 hours, has pain interfered with the following? General activity 1 Relation with others 1 Enjoyment of life 1 What TIME of day is your pain at its worst? morning , daytime, evening, and night Sleep (in general) Poor  Pain is worse with: walking, bending, and some activites Pain improves with: rest, pacing activities, and medication Relief from Meds: 6  Family History  Problem Relation Age of Onset   Coronary artery disease Father    Coronary artery disease Mother    Heart attack Brother    Coronary artery disease Sister    Social History   Socioeconomic History   Marital status: Married    Spouse name: Earnest   Number of children: 4   Years of education: Not on file   Highest education level: Not on file  Occupational History   Not on file  Tobacco Use   Smoking status: Every Day    Types: E-cigarettes    Last attempt to quit: 01/13/2009    Years since quitting: 14.8   Smokeless tobacco: Never  Vaping Use   Vaping status: Some Days   Substances: Nicotine  Substance and Sexual Activity   Alcohol use: No   Drug use: No   Sexual activity: Not Currently  Other Topics Concern   Not on file  Social History Narrative   Married in 1963.   4 grown children.   Social  Drivers of Corporate investment banker Strain: Low Risk  (11/04/2021)   Overall Financial Resource Strain (CARDIA)    Difficulty of Paying Living Expenses: Not hard at all  Food Insecurity: No Food Insecurity (11/04/2021)   Hunger Vital Sign    Worried About Running Out of Food in the Last Year: Never true    Ran Out of Food in the Last Year: Never true  Transportation Needs: No Transportation Needs (11/04/2021)   PRAPARE - Administrator, Civil Service (Medical): No    Lack of Transportation (Non-Medical): No  Physical Activity: Insufficiently Active (11/04/2021)   Exercise Vital Sign    Days of Exercise per Week: 5 days    Minutes of Exercise per Session: 10 min  Stress: No Stress Concern Present (11/04/2021)   [employer] of Occupational Health - Occupational Stress Questionnaire    Feeling of Stress : Not at all  Social Connections: Moderately Integrated (11/04/2021)   Social Connection and Isolation Panel    Frequency of Communication with Friends and Family: More than three times a week    Frequency of Social Gatherings with Friends and Family: Once a week    Attends Religious Services: 1 to 4 times per year    Active Member of [employer] or Organizations: No    Attends Club or  Organization Meetings: Never    Marital Status: Married   Past Surgical History:  Procedure Laterality Date   BACK SURGERY     remote, no history for me to review.   PTCA     Past Surgical History:  Procedure Laterality Date   BACK SURGERY     remote, no history for me to review.   PTCA     Past Medical History:  Diagnosis Date   Arthritis    CAD (coronary artery disease)    COPD (chronic obstructive pulmonary disease) (HCC)    Dyslipidemia    Fibromyalgia    GERD (gastroesophageal reflux disease)    HTN (hypertension)    Palpitations    Rheumatoid arthritis (HCC)    Thyroid  disease    BP (!) 147/82 (BP Location: Left Arm, Patient Position: Sitting, Cuff Size: Large)   Pulse  66   Ht 5' (1.524 m)   Wt 128 lb 6.4 oz (58.2 kg)   SpO2 98%   BMI 25.08 kg/m   Opioid Risk Score:   Fall Risk Score:  `1  Depression screen [facility] 2/9     05/21/2023    3:58 PM 04/16/2023    1:01 PM 10/24/2022    2:06 PM 08/28/2022    1:33 PM 05/19/2022    2:21 PM 03/27/2022    3:33 PM 03/03/2022   11:26 AM  Depression screen PHQ 2/9  Decreased Interest 1 1 0 1 0 0 3  Down, Depressed, Hopeless 1 1 0 0 0 0 3  PHQ - 2 Score 2 2 0 1 0 0 6  Altered sleeping 0    0 0 3  Tired, decreased energy 3    0 0 3  Change in appetite 0    0 0 0  Feeling bad or failure about yourself  1    0 0 2  Trouble concentrating 0    0 0 1  Moving slowly or fidgety/restless 3    0 0 0  Suicidal thoughts 0    0 0 0  PHQ-9 Score 9    0 0 15  Difficult doing work/chores Somewhat difficult    Not difficult at all Not difficult at all Very difficult       Review of Systems  Musculoskeletal:  Positive for arthralgias, back pain, gait problem, myalgias and neck pain.       Neck pain, bilateral shoulder pain radiating into both arms to fingers tips.  Back pain radiates down into both legs to toes. Neuropathy       Objective:   Physical Exam Vitals and nursing note reviewed.  Constitutional:      Appearance: Normal appearance.   Cardiovascular:     Rate and Rhythm: Normal rate and regular rhythm.     Pulses: Normal pulses.     Heart sounds: Normal heart sounds.  Pulmonary:     Effort: Pulmonary effort is normal.     Breath sounds: Normal breath sounds.   Musculoskeletal:     Comments: Normal Muscle Bulk and Muscle Testing Reveals:  Upper Extremities: Right: Decreased ROM 20 Degrees  and Muscle Strength 3/5 Left Upper Extremity: Decreased ROM 90 Degrees and Muscle Strength 4/5 Bilateral AC Joint Tenderness Thoracic and Lumbar Hypersensitivity Bilateral Greater trochanter Tenderness Lower Extremities: Decreased ROM and Muscle Strength 5/5 Bilateral Lower Extremities Flexion Produces Pain into her  bilateral Patella's and Bilateral Lower Extremities Arises from Table slowly Narrow Based  Gait      Skin:  General: Skin is warm and dry.   Neurological:     Mental Status: She is alert and oriented to person, place, and time.   Psychiatric:        Mood and Affect: Mood normal.        Behavior: Behavior normal.         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 Continue to Monitor. 11/02/2023 Fibromyalgia: Continue HEP as Tolerated. Continue Gabapentin  medication regimen and we will continue to monitor. 11/02/2023 Chronic Pain Syndrome:  Refilled: Oxycodone  10 /325 take two tablets every 6 hours as needed for pain #240. We will continue the opioid monitoring program, this consists of regular clinic visits, examinations, urine drug screen, pill counts as well as use of [place]  Controlled Substance Reporting system. A 12 month History has been reviewed on the [place]  Controlled Substance Reporting System 11/02/2023     F/U in 1 month

## 2023-11-06 ENCOUNTER — Telehealth: Payer: Self-pay

## 2023-11-06 NOTE — Telephone Encounter (Signed)
 No voice ID on current phone number. A message has been left of a call back.

## 2023-11-06 NOTE — Telephone Encounter (Signed)
 Called and left voicemail for patient to contact pharmacy to see when her prescription for Oxycodone  will be ready for pick up.  [facility]oke with [facility] this morning to give information needed and was advised by Pharmacist that rx will be filled.  Patient needs to contact the pharmacy.

## 2023-11-06 NOTE — Telephone Encounter (Signed)
 Patient was seen in the office on 11/02/23 and received a refill for Oxycodone  10-325mg , take 2 tablets by mouth every 6 hours as needed for pain, #240.  This CMA was out of the office on 11/05/23 and [facility] called and left voicemail and faxed over the information needed in [facility] for [facility] this patient.   Information needed was ICD-10 codes and the MME which was listed in chart from 11/02/23 office visit.  [facility] and [facility] to give information they needed in order to fill the patients medication.  [facility]-10 codes and MME information.  This information was found in the most recent office notes from 11/02/23.  This CMA verbally shared all information needed with the Pharmacist (ICD-10 codes M79.A29, M05.79, Z98.890, M13.0, G89.4 and the MME 120 mg information was given.  Pharmacy now has the information needed in order to proceed with filling the prescription for Oxycodone  10-325 mg, #240 and per Pharmacist they will fill it today 11/06/23.

## 2023-11-07 LAB — TOXASSURE SELECT,+ANTIDEPR,UR

## 2023-11-09 ENCOUNTER — Ambulatory Visit: Payer: Self-pay | Admitting: Registered Nurse

## 2023-11-09 NOTE — Telephone Encounter (Signed)
 UDS Results was reviewed.  PMP was Reviewed.  + Hydrocodone  and Oxycodone : This is a inconsistent UDS. Call placed to Ms Medders regarding the above, no answer.  Left message to return the call. This is a violation to her contract, send her a written warning letter.  The above will be discussed with Dr Lorilee, also awaiting Ms. Tedeschi call back with explanation.

## 2023-11-09 NOTE — Telephone Encounter (Signed)
 According to PMP, Hydrocodone  hasn't been prescribed since 2023

## 2023-11-27 NOTE — Telephone Encounter (Signed)
 Letter with lab results and formal warning sent to Mrs Hush.

## 2023-11-30 ENCOUNTER — Telehealth: Payer: Self-pay | Admitting: *Deleted

## 2023-11-30 ENCOUNTER — Other Ambulatory Visit: Payer: Self-pay | Admitting: Physical Medicine and Rehabilitation

## 2023-11-30 MED ORDER — OXYCODONE-ACETAMINOPHEN 10-325 MG PO TABS: 2.0000 | ORAL_TABLET | Freq: Four times a day (QID) | ORAL | 0 refills | Status: DC | PRN

## 2023-11-30 NOTE — Telephone Encounter (Signed)
 Jasmin [name] called for refill of her oxycodone . She got it refilled per PMP 11/06/23 so she will reach 30 days on 12/05/23.

## 2023-12-11 ENCOUNTER — Encounter: Admitting: Physical Medicine and Rehabilitation

## 2023-12-23 ENCOUNTER — Other Ambulatory Visit: Payer: Self-pay | Admitting: Physical Medicine and Rehabilitation

## 2023-12-28 ENCOUNTER — Encounter: Attending: Registered Nurse | Admitting: Registered Nurse

## 2023-12-28 ENCOUNTER — Encounter: Payer: Self-pay | Admitting: Registered Nurse

## 2023-12-28 VITALS — BP 130/74 | HR 60 | Ht 60.0 in | Wt 133.0 lb

## 2023-12-28 DIAGNOSIS — Z79891 Long term (current) use of opiate analgesic: Secondary | ICD-10-CM | POA: Diagnosis not present

## 2023-12-28 DIAGNOSIS — G6289 Other specified polyneuropathies: Secondary | ICD-10-CM | POA: Insufficient documentation

## 2023-12-28 DIAGNOSIS — M961 Postlaminectomy syndrome, not elsewhere classified: Secondary | ICD-10-CM | POA: Insufficient documentation

## 2023-12-28 DIAGNOSIS — G894 Chronic pain syndrome: Secondary | ICD-10-CM | POA: Insufficient documentation

## 2023-12-28 DIAGNOSIS — Z5181 Encounter for therapeutic drug level monitoring: Secondary | ICD-10-CM | POA: Insufficient documentation

## 2023-12-28 DIAGNOSIS — M2550 Pain in unspecified joint: Secondary | ICD-10-CM | POA: Diagnosis not present

## 2023-12-28 MED ORDER — OXYCODONE-ACETAMINOPHEN 10-325 MG PO TABS: 2.0000 | ORAL_TABLET | Freq: Four times a day (QID) | ORAL | 0 refills | Status: DC | PRN

## 2023-12-28 NOTE — Progress Notes (Signed)
 Subjective:    Patient ID: Jasmin [name], female    DOB: 12/05/1945, 78 y.o.   MRN: [id number]  HPI: Jasmin [name] is a 78 y.o. female who returns for follow up appointment for chronic pain and medication refill. She states her  pain is located in her neck, lower back and bilateral upper and lower extremities with tingling and burning Also reports generalized joint pain.She rates her pain 7. Her current exercise regime is walking and performing stretching exercises.  [name] Morphine equivalent is 120.00 MME.   Last UDS was Performed on 11/02/23: + Hydrocodone  and Oxycodone  , she received a Formal warning letter.      Pain Inventory Average Pain 8 Pain Right Now 7 My pain is constant, sharp, burning, dull, stabbing, tingling, and aching  In the last 24 hours, has pain interfered with the following? General activity 2 Relation with others 1 Enjoyment of life 2 What TIME of day is your pain at its worst? morning , daytime, evening, and night Sleep (in general) Poor  Pain is worse with: walking, bending, sitting, standing, and some activites Pain improves with: rest and medication Relief from Meds: 8  Family History  Problem Relation Age of Onset   Coronary artery disease Father    Coronary artery disease Mother    Heart attack Brother    Coronary artery disease Sister    Social History   Socioeconomic History   Marital status: Married    Spouse name: Earnest   Number of children: 4   Years of education: Not on file   Highest education level: Not on file  Occupational History   Not on file  Tobacco Use   Smoking status: Every Day    Types: E-cigarettes    Last attempt to quit: 01/13/2009    Years since quitting: 14.9   Smokeless tobacco: Never  Vaping Use   Vaping status: Some Days   Substances: Nicotine  Substance and Sexual Activity   Alcohol use: No   Drug use: No   Sexual activity: Not Currently  Other Topics Concern   Not on file  Social History  Narrative   Married in 1963.   4 grown children.   Social Drivers of Corporate investment banker Strain: Low Risk  (11/04/2021)   Overall Financial Resource Strain (CARDIA)    Difficulty of Paying Living Expenses: Not hard at all  Food Insecurity: No Food Insecurity (11/04/2021)   Hunger Vital Sign    Worried About Running Out of Food in the Last Year: Never true    Ran Out of Food in the Last Year: Never true  Transportation Needs: No Transportation Needs (11/04/2021)   PRAPARE - Administrator, Civil Service (Medical): No    Lack of Transportation (Non-Medical): No  Physical Activity: Insufficiently Active (11/04/2021)   Exercise Vital Sign    Days of Exercise per Week: 5 days    Minutes of Exercise per Session: 10 min  Stress: No Stress Concern Present (11/04/2021)   [employer] of Occupational Health - Occupational Stress Questionnaire    Feeling of Stress : Not at all  Social Connections: Moderately Integrated (11/04/2021)   Social Connection and Isolation Panel    Frequency of Communication with Friends and Family: More than three times a week    Frequency of Social Gatherings with Friends and Family: Once a week    Attends Religious Services: 1 to 4 times per year    Active Member  of Clubs or Organizations: No    Attends Banker Meetings: Never    Marital Status: Married   Past Surgical History:  Procedure Laterality Date   BACK SURGERY     remote, no history for me to review.   PTCA     Past Surgical History:  Procedure Laterality Date   BACK SURGERY     remote, no history for me to review.   PTCA     Past Medical History:  Diagnosis Date   Arthritis    CAD (coronary artery disease)    COPD (chronic obstructive pulmonary disease) (HCC)    Dyslipidemia    Fibromyalgia    GERD (gastroesophageal reflux disease)    HTN (hypertension)    Palpitations    Rheumatoid arthritis (HCC)    Thyroid  disease    There were no vitals taken for  this visit.  Opioid Risk Score:   Fall Risk Score:  `1  Depression screen [facility] 2/9     05/21/2023    3:58 PM 04/16/2023    1:01 PM 10/24/2022    2:06 PM 08/28/2022    1:33 PM 05/19/2022    2:21 PM 03/27/2022    3:33 PM 03/03/2022   11:26 AM  Depression screen PHQ 2/9  Decreased Interest 1 1 0 1 0 0 3  Down, Depressed, Hopeless 1 1 0 0 0 0 3  PHQ - 2 Score 2 2 0 1 0 0 6  Altered sleeping 0    0 0 3  Tired, decreased energy 3    0 0 3  Change in appetite 0    0 0 0  Feeling bad or failure about yourself  1    0 0 2  Trouble concentrating 0    0 0 1  Moving slowly or fidgety/restless 3    0 0 0  Suicidal thoughts 0    0 0 0  PHQ-9 Score 9    0 0 15  Difficult doing work/chores Somewhat difficult    Not difficult at all Not difficult at all Very difficult    Review of Systems  Musculoskeletal:  Positive for back pain, gait problem and neck pain.       Pain all over the body  Neurological:  Positive for headaches.  All other systems reviewed and are negative.      Objective:   Physical Exam Vitals and nursing note reviewed.  Constitutional:      Appearance: Normal appearance.  Cardiovascular:     Rate and Rhythm: Normal rate and regular rhythm.     Pulses: Normal pulses.     Heart sounds: Normal heart sounds.  Pulmonary:     Effort: Pulmonary effort is normal.     Breath sounds: Normal breath sounds.  Musculoskeletal:     Comments: Normal Muscle Bulk and Muscle Testing Reveals:  Upper Extremities: Right: Decreased ROM 30 Degrees  and Muscle Strength 5/5 Left Upper Extremity: Full ROM and Muscle Strength 5/5  Thoracic and Lumbar Hypersensitivity Bilateral Greater Trochanter Tenderness Lower Extremities: Decreased ROM and Muscle Strength 5/5 Right Lower Extremity Flexion Produces Pain into her Right Hip and Right Knee  Left Lower Extremity Flexion Produces Pain into her Left Patella Arises from chair slowly Antalgic  Gait     Skin:    General: Skin is warm and dry.   Neurological:     Mental Status: She is alert and oriented to person, place, and time.  Psychiatric:  Mood and Affect: Mood normal.        Behavior: Behavior normal.           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 Continue to Monitor. 12/28/2023 Fibromyalgia: Continue HEP as Tolerated. Continue Gabapentin  medication regimen and we will continue to monitor. 12/28/2023 Chronic Pain Syndrome:  Refilled: Oxycodone  10 /325 take two tablets every 6 hours as needed for pain #240. We will continue the opioid monitoring program, this consists of regular clinic visits, examinations, urine drug screen, pill counts as well as use of [place]  Controlled Substance Reporting system. A 12 month History has been reviewed on the [place]  Controlled Substance Reporting System 12/28/2023     F/U with Dr Lorilee

## 2024-01-04 ENCOUNTER — Ambulatory Visit (INDEPENDENT_AMBULATORY_CARE_PROVIDER_SITE_OTHER): Admitting: Family Medicine

## 2024-01-04 ENCOUNTER — Encounter: Payer: Self-pay | Admitting: Family Medicine

## 2024-01-04 VITALS — BP 120/60 | HR 58 | Temp 98.0°F | Ht 60.0 in | Wt 130.8 lb

## 2024-01-04 DIAGNOSIS — K8681 Exocrine pancreatic insufficiency: Secondary | ICD-10-CM

## 2024-01-04 DIAGNOSIS — E7800 Pure hypercholesterolemia, unspecified: Secondary | ICD-10-CM | POA: Diagnosis not present

## 2024-01-04 DIAGNOSIS — R002 Palpitations: Secondary | ICD-10-CM

## 2024-01-04 DIAGNOSIS — E785 Hyperlipidemia, unspecified: Secondary | ICD-10-CM

## 2024-01-04 DIAGNOSIS — I2510 Atherosclerotic heart disease of native coronary artery without angina pectoris: Secondary | ICD-10-CM

## 2024-01-04 DIAGNOSIS — R682 Dry mouth, unspecified: Secondary | ICD-10-CM

## 2024-01-04 DIAGNOSIS — I10 Essential (primary) hypertension: Secondary | ICD-10-CM

## 2024-01-04 DIAGNOSIS — E039 Hypothyroidism, unspecified: Secondary | ICD-10-CM | POA: Diagnosis not present

## 2024-01-04 MED ORDER — PROPRANOLOL HCL 10 MG PO TABS: ORAL_TABLET | ORAL | 3 refills | Status: AC

## 2024-01-04 NOTE — Progress Notes (Signed)
 Subjective:    Patient ID: Jasmin [name], female    DOB: 1945-08-26, 78 y.o.   MRN: [id number] Patient is a very pleasant 78 year old Caucasian female who is here today for checkup.  She has a history of coronary artery disease.  She has had several catheterizations and angioplasties.  She is currently on Plavix  indefinitely.  She is not on a statin due to statin intolerance.  She is currently taking metoprolol  and lisinopril .  She is overdue to check her cholesterol.  She states that her cardiologist retired.  She also has a history of chronic low back pain.  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  She continues to have low back pain even at the present time but is debilitating.  She is walking with a cane.  She has a remote history of rheumatoid arthritis.  Recently she reports extremely dry eyes and extremely dry mouth.  She states that she can barely open her eyes in the morning due to her eyes being so dry.  She has tried several eyedrops without benefit.  This raises the concern about possible Sjogren's syndrome.  She also has a history of hypothyroidism for which she takes Cytomel .  She is overdue for lab work to monitor all these conditions.  Of note, she states that her bloating and diarrhea and abdominal discomfort have improved dramatically after starting pancreatic enzyme replacement.  She is only taking 1 pill with meals.  She states that this has helped approximately 60%.  I recommended taking 2 pills with each meal Past Medical History:  Diagnosis Date   Arthritis    CAD (coronary artery disease)    COPD (chronic obstructive pulmonary disease) (HCC)    Dyslipidemia    Fibromyalgia    GERD (gastroesophageal reflux disease)    HTN (hypertension)    Palpitations    Rheumatoid arthritis (HCC)    Thyroid  disease    Past Surgical History:  Procedure Laterality Date   BACK SURGERY      remote, no history for me to review.   PTCA     Current Outpatient Medications on File Prior to Visit  Medication Sig Dispense Refill   cloNIDine  (CATAPRES ) 0.1 MG tablet TAKE 1 TABLET BY MOUTH TWICE A DAY 180 tablet 3   clopidogrel  (PLAVIX ) 75 MG tablet Take 1 tablet (75 mg total) by mouth daily. 90 tablet 3   furosemide  (LASIX ) 40 MG tablet TAKE 1 TABLET (40 MG TOTAL) BY MOUTH DAILY AS NEEDED FOR FLUID. 90 tablet 1   gabapentin  (NEURONTIN ) 100 MG capsule Take 1 capsule (100 mg total) by mouth 3 (three) times daily. 90 capsule 3   liothyronine  (CYTOMEL ) 5 MCG tablet Take one tablet by mouth twice a day on an empty stomach. 180 tablet 3   lisinopril  (ZESTRIL ) 40 MG tablet TAKE 1 TABLET BY MOUTH EVERY DAY 90 tablet 3   metoprolol  succinate (TOPROL -XL) 50 MG 24 hr tablet Take 1 tablet (50 mg total) by mouth daily. TAKE WITH OR IMMEDIATELY FOLLOWING A MEAL. 90 tablet 0   nitroGLYCERIN  (NITROSTAT ) 0.4 MG SL tablet Dissolve 1 tablet under the tongue every 5 minutes as needed for chest pain. Max of 3 doses, then 911. (Patient taking differently: Place 0.4 mg under the tongue every 5 (five) minutes as needed. Dissolve 1 tablet under the tongue every 5 minutes as needed for chest pain. Max of 3 doses,  then 911.) 25 tablet 6   oxyCODONE -acetaminophen  (PERCOCET) 10-325 MG tablet Take 2 tablets by mouth every 6 (six) hours as needed for pain. 240 tablet 0   Pancrelipase , Lip-Prot-Amyl, (ZENPEP ) 40000-126000 units CPEP Take 1 capsule (40,000 Units total) by mouth with breakfast, with lunch, and with evening meal. 90 capsule 4   predniSONE  (DELTASONE ) 1 MG tablet Take 1 tablet (1 mg total) by mouth daily as needed. 30 tablet 3   propranolol  (INDERAL ) 10 MG tablet TAKE 1 TABLET BY MOUTH UP TO FOUR TIMES DAILY AS NEEDED FOR PALPITATIONS 360 tablet 3   QUTENZA , 4 PATCH, 8 %      KLOR-CON  M20 20 MEQ tablet TAKE 1 TABLET (20 MEQ TOTAL) BY MOUTH DAILY AS NEEDED (WITH FUROSEMIDE ). (Patient not taking: Reported on  01/04/2024) 90 tablet 1   No current facility-administered medications on file prior to visit.     Allergies  Allergen Reactions   Crestor [Rosuvastatin]    Social History   Socioeconomic History   Marital status: Married    Spouse name: Earnest   Number of children: 4   Years of education: Not on file   Highest education level: Not on file  Occupational History   Not on file  Tobacco Use   Smoking status: Every Day    Types: E-cigarettes    Last attempt to quit: 01/13/2009    Years since quitting: 14.9   Smokeless tobacco: Never  Vaping Use   Vaping status: Some Days   Substances: Nicotine  Substance and Sexual Activity   Alcohol use: No   Drug use: No   Sexual activity: Not Currently  Other Topics Concern   Not on file  Social History Narrative   Married in 1963.   4 grown children.   Social Drivers of Corporate investment banker Strain: Low Risk  (11/04/2021)   Overall Financial Resource Strain (CARDIA)    Difficulty of Paying Living Expenses: Not hard at all  Food Insecurity: No Food Insecurity (11/04/2021)   Hunger Vital Sign    Worried About Running Out of Food in the Last Year: Never true    Ran Out of Food in the Last Year: Never true  Transportation Needs: No Transportation Needs (11/04/2021)   PRAPARE - Administrator, Civil Service (Medical): No    Lack of Transportation (Non-Medical): No  Physical Activity: Insufficiently Active (11/04/2021)   Exercise Vital Sign    Days of Exercise per Week: 5 days    Minutes of Exercise per Session: 10 min  Stress: No Stress Concern Present (11/04/2021)   [employer] of Occupational Health - Occupational Stress Questionnaire    Feeling of Stress : Not at all  Social Connections: Moderately Integrated (11/04/2021)   Social Connection and Isolation Panel    Frequency of Communication with Friends and Family: More than three times a week    Frequency of Social Gatherings with Friends and Family: Once a  week    Attends Religious Services: 1 to 4 times per year    Active Member of [employer] or Organizations: No    Attends Banker Meetings: Never    Marital Status: Married  Catering manager Violence: Not At Risk (11/04/2021)   Humiliation, Afraid, Rape, and Kick questionnaire    Fear of Current or Ex-Partner: No    Emotionally Abused: No    Physically Abused: No    Sexually Abused: No     Review of Systems  All other systems  reviewed and are negative.      Objective:   Physical Exam Vitals reviewed.  Constitutional:      General: She is not in acute distress.    Appearance: Normal appearance. She is normal weight. She is not ill-appearing or toxic-appearing.  Cardiovascular:     Rate and Rhythm: Normal rate and regular rhythm.     Pulses: Normal pulses.     Heart sounds: Normal heart sounds. No murmur heard.    No friction rub. No gallop.  Pulmonary:     Effort: Pulmonary effort is normal. No accessory muscle usage, respiratory distress or retractions.     Breath sounds: No stridor. No wheezing, rhonchi or rales.  Abdominal:     General: Bowel sounds are normal. There is no distension.     Palpations: Abdomen is soft. There is no mass.     Tenderness: There is no abdominal tenderness. There is no guarding.  Neurological:     Mental Status: She is alert.        Assessment & Plan:  Coronary artery disease involving native coronary artery of native heart without angina pectoris - Plan: CBC with Differential/Platelet, Comprehensive metabolic panel with GFR, Lipid panel, TSH, propranolol  (INDERAL ) 10 MG tablet  Hypothyroidism, unspecified type - Plan: CBC with Differential/Platelet, Comprehensive metabolic panel with GFR, Lipid panel, TSH  Benign essential HTN - Plan: CBC with Differential/Platelet, Comprehensive metabolic panel with GFR, Lipid panel, TSH  Pure hypercholesterolemia - Plan: CBC with Differential/Platelet, Comprehensive metabolic panel with GFR,  Lipid panel, TSH  Palpitations - Plan: propranolol  (INDERAL ) 10 MG tablet  Hyperlipidemia LDL goal <70 - Plan: propranolol  (INDERAL ) 10 MG tablet  Dry mouth - Plan: Sjogren's syndrome antibods(ssa + ssb)  Exocrine pancreatic insufficiency Zenpep  for pancreatic exocrine insufficiency has helped.  I recommended that she increase to 2 capsules per meal.  Her blood pressure today is well-controlled.  I would like to check her lipid panel.  Given her history of coronary artery disease I would like her LDL cholesterol to be under 55.  Consider Repatha  if greater than 55.  But her dry mouth and remote history of rheumatoid arthritis I will screen for stroke in syndrome by checking SSA and SSB antibodies.  I will check a TSH to ensure adequate dosage of her Cytomel .  Also monitor a CBC and a CMP.

## 2024-01-05 LAB — CBC WITH DIFFERENTIAL/PLATELET
Absolute Lymphocytes: 1781 {cells}/uL (ref 850–3900)
Absolute Monocytes: 442 {cells}/uL (ref 200–950)
Basophils Absolute: 112 {cells}/uL (ref 0–200)
Basophils Relative: 2 %
Eosinophils Absolute: 112 {cells}/uL (ref 15–500)
Eosinophils Relative: 2 %
HCT: 34.7 % — ABNORMAL LOW (ref 35.0–45.0)
Hemoglobin: 11.2 g/dL — ABNORMAL LOW (ref 11.7–15.5)
MCH: 31.5 pg (ref 27.0–33.0)
MCHC: 32.3 g/dL (ref 32.0–36.0)
MCV: 97.7 fL (ref 80.0–100.0)
MPV: 9.7 fL (ref 7.5–12.5)
Monocytes Relative: 7.9 %
Neutro Abs: 3153 {cells}/uL (ref 1500–7800)
Neutrophils Relative %: 56.3 %
Platelets: 269 Thousand/uL (ref 140–400)
RBC: 3.55 Million/uL — ABNORMAL LOW (ref 3.80–5.10)
RDW: 13.6 % (ref 11.0–15.0)
Total Lymphocyte: 31.8 %
WBC: 5.6 Thousand/uL (ref 3.8–10.8)

## 2024-01-05 LAB — COMPREHENSIVE METABOLIC PANEL WITH GFR
AG Ratio: 2.1 (calc) (ref 1.0–2.5)
ALT: 7 U/L (ref 6–29)
AST: 14 U/L (ref 10–35)
Albumin: 4.4 g/dL (ref 3.6–5.1)
Alkaline phosphatase (APISO): 94 U/L (ref 37–153)
BUN: 11 mg/dL (ref 7–25)
CO2: 29 mmol/L (ref 20–32)
Calcium: 9.3 mg/dL (ref 8.6–10.4)
Chloride: 100 mmol/L (ref 98–110)
Creat: 0.84 mg/dL (ref 0.60–1.00)
Globulin: 2.1 g/dL (ref 1.9–3.7)
Glucose, Bld: 91 mg/dL (ref 65–99)
Potassium: 4.8 mmol/L (ref 3.5–5.3)
Sodium: 136 mmol/L (ref 135–146)
Total Bilirubin: 0.3 mg/dL (ref 0.2–1.2)
Total Protein: 6.5 g/dL (ref 6.1–8.1)
eGFR: 71 mL/min/1.73m2 (ref >=60)

## 2024-01-05 LAB — LIPID PANEL
Cholesterol: 207 mg/dL — ABNORMAL HIGH (ref <200)
HDL: 76 mg/dL (ref >=50)
LDL Cholesterol (Calc): 106 mg/dL — ABNORMAL HIGH
Non-HDL Cholesterol (Calc): 131 mg/dL — ABNORMAL HIGH (ref <130)
Total CHOL/HDL Ratio: 2.7 (calc) (ref <5.0)
Triglycerides: 137 mg/dL (ref <150)

## 2024-01-05 LAB — TSH: TSH: 1.87 m[IU]/L (ref 0.40–4.50)

## 2024-01-05 LAB — SJOGREN'S SYNDROME ANTIBODS(SSA + SSB)
SSA (Ro) (ENA) Antibody, IgG: <1 AI
SSB (La) (ENA) Antibody, IgG: <1 AI

## 2024-01-07 ENCOUNTER — Ambulatory Visit: Payer: Self-pay | Admitting: Family Medicine

## 2024-01-21 ENCOUNTER — Other Ambulatory Visit: Payer: Self-pay | Admitting: Physical Medicine and Rehabilitation

## 2024-01-22 ENCOUNTER — Other Ambulatory Visit: Payer: Self-pay

## 2024-01-22 ENCOUNTER — Telehealth: Payer: Self-pay | Admitting: Family Medicine

## 2024-01-22 DIAGNOSIS — I2510 Atherosclerotic heart disease of native coronary artery without angina pectoris: Secondary | ICD-10-CM

## 2024-01-22 DIAGNOSIS — I10 Essential (primary) hypertension: Secondary | ICD-10-CM

## 2024-01-22 MED ORDER — METOPROLOL SUCCINATE ER 50 MG PO TB24: 50.0000 mg | ORAL_TABLET | Freq: Every day | ORAL | 1 refills | Status: AC

## 2024-01-22 NOTE — Telephone Encounter (Signed)
 Prescription Request  01/22/2024  LOV: 01/04/2024  What is the name of the medication or equipment?   metoprolol  succinate (TOPROL -XL) 50 MG 24 hr tablet  **90 day script requested**  Have you contacted your pharmacy to request a refill? Yes   Which pharmacy would you like this sent to?  [id number] [address] [place], [place] - [address] [place] [address] Phone: [phone] Fax: [phone]    Patient notified that their request is being sent to the clinical staff for review and that they should receive a response within 2 business days.   Please advise pharmacist.

## 2024-01-26 ENCOUNTER — Other Ambulatory Visit: Payer: Self-pay | Admitting: Family Medicine

## 2024-01-28 NOTE — Telephone Encounter (Signed)
 Requested Prescriptions  Pending Prescriptions Disp Refills   gabapentin  (NEURONTIN ) 100 MG capsule [Pharmacy Med Name: GABAPENTIN  100 MG CAPSULE] 270 capsule 0    Sig: TAKE 1 CAPSULE (100 MG TOTAL) BY MOUTH THREE TIMES DAILY.     Neurology: Anticonvulsants - gabapentin  Passed - 01/28/2024  1:31 PM      Passed - Cr in normal range and within 360 days    Creat  Date Value Ref Range Status  01/04/2024 0.84 0.60 - 1.00 mg/dL Final         Passed - Completed PHQ-2 or PHQ-9 in the last 360 days      Passed - Valid encounter within last 12 months    Recent Outpatient Visits           3 weeks ago Coronary artery disease involving native coronary artery of native heart without angina pectoris   [facility] Pickard, Butler DASEN, MD   8 months ago Epigastric pain   [facility] Duanne, Butler DASEN, MD   1 year ago DOE (dyspnea on exertion)   [facility] Duanne Butler DASEN, MD   1 year ago Nausea   [facility] Duanne Butler DASEN, MD   1 year ago Palpitations   [facility] Pickard, Butler DASEN, MD

## 2024-01-29 ENCOUNTER — Telehealth: Payer: Self-pay

## 2024-01-29 NOTE — Telephone Encounter (Signed)
 Patient calling into the office and left a voicemail requesting a refill on Oxycodone .  Patient was last seen in office on 12/28/2023.  Please advise if refill is deemed appropriate.

## 2024-01-30 ENCOUNTER — Other Ambulatory Visit: Payer: Self-pay | Admitting: Physical Medicine and Rehabilitation

## 2024-01-30 ENCOUNTER — Telehealth: Payer: Self-pay | Admitting: Registered Nurse

## 2024-01-30 DIAGNOSIS — G894 Chronic pain syndrome: Secondary | ICD-10-CM

## 2024-01-30 DIAGNOSIS — M961 Postlaminectomy syndrome, not elsewhere classified: Secondary | ICD-10-CM

## 2024-01-30 MED ORDER — OXYCODONE-ACETAMINOPHEN 10-325 MG PO TABS
2.0000 | ORAL_TABLET | Freq: Four times a day (QID) | ORAL | 0 refills | Status: DC | PRN
Start: 2024-01-30 — End: 2024-02-19

## 2024-01-30 NOTE — Telephone Encounter (Signed)
 PMP was Reviewed.  Oxycodone  e-scribed to pharmacy.  Call placed to Jasmin [name], no answer, left message to return the call.

## 2024-01-31 NOTE — Telephone Encounter (Signed)
 Patient returned your call.

## 2024-02-19 ENCOUNTER — Encounter: Payer: Self-pay | Admitting: Physical Medicine and Rehabilitation

## 2024-02-19 ENCOUNTER — Encounter: Attending: Registered Nurse | Admitting: Physical Medicine and Rehabilitation

## 2024-02-19 VITALS — BP 147/78 | HR 65 | Ht 60.0 in | Wt 129.0 lb

## 2024-02-19 DIAGNOSIS — G6289 Other specified polyneuropathies: Secondary | ICD-10-CM | POA: Diagnosis not present

## 2024-02-19 DIAGNOSIS — M961 Postlaminectomy syndrome, not elsewhere classified: Secondary | ICD-10-CM | POA: Diagnosis not present

## 2024-02-19 DIAGNOSIS — M25571 Pain in right ankle and joints of right foot: Secondary | ICD-10-CM | POA: Insufficient documentation

## 2024-02-19 DIAGNOSIS — G894 Chronic pain syndrome: Secondary | ICD-10-CM | POA: Diagnosis not present

## 2024-02-19 MED ORDER — OXYCODONE-ACETAMINOPHEN 10-325 MG PO TABS: 2.0000 | ORAL_TABLET | Freq: Four times a day (QID) | ORAL | 0 refills | Status: DC | PRN

## 2024-02-19 MED ORDER — CAPSAICIN-CLEANSING GEL 8 % EX KIT
4.0000 | PACK | Freq: Once | CUTANEOUS | Status: AC
  Administered 2024-02-19: 4 via TOPICAL

## 2024-02-19 NOTE — Patient Instructions (Signed)
 500mg  magnesium glycinate at night

## 2024-02-19 NOTE — Addendum Note (Signed)
 Addended by: LORILEE SVEN SQUIBB on: 02/19/2024 02:49 PM   Modules accepted: Orders

## 2024-02-19 NOTE — Progress Notes (Signed)
-  Discussed Qutenza as an option for neuropathic pain control. Discussed that this is a capsaicin patch, stronger than capsaicin cream. Discussed that it is currently approved for diabetic peripheral neuropathy and post-herpetic neuralgia, but that it has also shown benefit in treating other forms of neuropathy. Provided patient with link to site to learn more about the patch: https://www.clark.biz/. Discussed that the patch would be placed in office and benefits usually last 3 months. Discussed that unintended exposure to capsaicin can cause severe irritation of eyes, mucous membranes, respiratory tract, and skin, but that Qutenza is a local treatment and does not have the systemic side effects of other nerve medications. Discussed that there may be pain, itching, erythema, and decreased sensory function associated with the application of Qutenza. Side effects usually subside within 1 week. A cold pack of analgesic medications can help with these side effects. Blood pressure can also be increased due to pain associated with administration of the patch.   4 patches of Qutenza [phone]) was applied to the bilateral feet. Ice packs were applied during the procedure to ensure patient comfort. Blood pressure was monitored every 15 minutes. The patient tolerated the procedure well. Post-procedure instructions were given and follow-up has been scheduled.  Topical system measures 14cm x20cm (280cm for a total 1120units) were applied which will cause deeper penetration for destruction of the peripheral nerve using a chemical (Qutenza) which infuses into the skin like an injection and heat technique (occlusive, compressive dressing cauing endothermic heat technique)

## 2024-02-19 NOTE — Addendum Note (Signed)
 Addended by: LORILEE SVEN SQUIBB on: 02/19/2024 02:46 PM   Modules accepted: Orders

## 2024-02-27 ENCOUNTER — Other Ambulatory Visit: Payer: Self-pay | Admitting: Family Medicine

## 2024-03-27 ENCOUNTER — Other Ambulatory Visit (INDEPENDENT_AMBULATORY_CARE_PROVIDER_SITE_OTHER): Payer: Self-pay

## 2024-03-27 DIAGNOSIS — Z23 Encounter for immunization: Secondary | ICD-10-CM

## 2024-03-27 NOTE — Progress Notes (Signed)
 Patient is in office today for a nurse visit for High dose fluzone Immunization. Patient Injection was given in the  Right deltoid. Patient tolerated injection well.

## 2024-03-28 ENCOUNTER — Telehealth: Payer: Self-pay | Admitting: *Deleted

## 2024-03-28 ENCOUNTER — Other Ambulatory Visit: Payer: Self-pay | Admitting: Physical Medicine and Rehabilitation

## 2024-03-28 DIAGNOSIS — M961 Postlaminectomy syndrome, not elsewhere classified: Secondary | ICD-10-CM

## 2024-03-28 DIAGNOSIS — G894 Chronic pain syndrome: Secondary | ICD-10-CM

## 2024-03-28 MED ORDER — OXYCODONE-ACETAMINOPHEN 10-325 MG PO TABS: 2.0000 | ORAL_TABLET | Freq: Four times a day (QID) | ORAL | 0 refills | Status: DC | PRN

## 2024-03-28 NOTE — Telephone Encounter (Signed)
 Jasmin [name] called to get a refill on her oxycodone  acetaminophen .

## 2024-04-02 ENCOUNTER — Encounter: Payer: Self-pay | Admitting: Family Medicine

## 2024-04-02 ENCOUNTER — Other Ambulatory Visit: Payer: Self-pay

## 2024-04-02 ENCOUNTER — Telehealth: Payer: Self-pay

## 2024-04-02 DIAGNOSIS — R002 Palpitations: Secondary | ICD-10-CM

## 2024-04-02 DIAGNOSIS — E785 Hyperlipidemia, unspecified: Secondary | ICD-10-CM

## 2024-04-02 DIAGNOSIS — I2510 Atherosclerotic heart disease of native coronary artery without angina pectoris: Secondary | ICD-10-CM

## 2024-04-02 DIAGNOSIS — I340 Nonrheumatic mitral (valve) insufficiency: Secondary | ICD-10-CM

## 2024-04-02 MED ORDER — NITROGLYCERIN 0.4 MG SL SUBL: SUBLINGUAL_TABLET | SUBLINGUAL | 6 refills | Status: AC

## 2024-04-02 NOTE — Telephone Encounter (Signed)
 Nitroglycerin  refill has been sent. Thank you.    She is seeking a refill on nitroGLYCERIN  0.4 MG SL tablet.   In addition, she is requesting the Zenpep  40000-126000 units Cpep be increased from 1-2 pills daily to 2-3.

## 2024-04-03 ENCOUNTER — Other Ambulatory Visit: Payer: Self-pay | Admitting: Family Medicine

## 2024-04-03 MED ORDER — ZENPEP 40000-126000 UNITS PO CPEP: 2.0000 | ORAL_CAPSULE | Freq: Three times a day (TID) | ORAL | 3 refills | Status: AC

## 2024-04-21 ENCOUNTER — Encounter: Attending: Registered Nurse | Admitting: Physical Medicine and Rehabilitation

## 2024-04-21 ENCOUNTER — Encounter: Admitting: Physical Medicine and Rehabilitation

## 2024-04-21 ENCOUNTER — Encounter: Payer: Self-pay | Admitting: Physical Medicine and Rehabilitation

## 2024-04-21 VITALS — BP 149/82 | HR 73 | Ht 60.0 in | Wt 133.2 lb

## 2024-04-21 DIAGNOSIS — L0390 Cellulitis, unspecified: Secondary | ICD-10-CM | POA: Diagnosis not present

## 2024-04-21 DIAGNOSIS — G894 Chronic pain syndrome: Secondary | ICD-10-CM | POA: Insufficient documentation

## 2024-04-21 DIAGNOSIS — I10 Essential (primary) hypertension: Secondary | ICD-10-CM | POA: Diagnosis not present

## 2024-04-21 DIAGNOSIS — M961 Postlaminectomy syndrome, not elsewhere classified: Secondary | ICD-10-CM | POA: Insufficient documentation

## 2024-04-21 MED ORDER — OXYCODONE-ACETAMINOPHEN 10-325 MG PO TABS: 2.0000 | ORAL_TABLET | Freq: Four times a day (QID) | ORAL | 0 refills | Status: DC | PRN

## 2024-04-21 MED ORDER — CEPHALEXIN 500 MG PO CAPS: 500.0000 mg | ORAL_CAPSULE | Freq: Two times a day (BID) | ORAL | 0 refills | Status: DC

## 2024-04-21 NOTE — Progress Notes (Signed)
 A user error has taken place: encounter opened in error, closed for administrative reasons.

## 2024-04-21 NOTE — Progress Notes (Signed)
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 Darvocet, Oxycontin , Fentanyl  -discussed that she feels this is due to her spinal pathology  -continue red light therapy  -reviewed sensory testing results  -discussed that Qutenza  copay of $250 was too expensive for her- will try again for next time as could be covered if she meets her deductible  -Discussed Qutenza  as an option for neuropathic pain control. Discussed that this is a capsaicin  patch, stronger than capsaicin  cream. Discussed that it is currently approved for diabetic peripheral neuropathy and post-herpetic neuralgia, but that it has also shown benefit in treating other forms of neuropathy. Provided patient with link to site to learn more about the patch: https://www.qutenza .com/. Discussed that the patch would be placed in office and benefits usually last 3  months. Discussed that unintended exposure to capsaicin  can cause severe irritation of eyes, mucous membranes, respiratory tract, and skin, but that Qutenza  is a local treatment and does not have the systemic side effects of other nerve medications. Discussed that there may be pain, itching, erythema, and decreased sensory function associated with the application of Qutenza . Side effects usually subside within 1 week. A cold pack of analgesic medications can help with these side effects. Blood pressure can also be increased due to pain associated with administration of the patch.   14. Weight loss/abdominal pain: -continue pancrelipase   15) Wound on anterior left shin: -keflex  prescribed -referred to wound care clinic

## 2024-04-29 ENCOUNTER — Ambulatory Visit: Admitting: Family Medicine

## 2024-04-29 VITALS — BP 160/80 | HR 93 | Temp 98.2°F | Ht 60.0 in | Wt 129.0 lb

## 2024-04-29 DIAGNOSIS — L03116 Cellulitis of left lower limb: Secondary | ICD-10-CM

## 2024-04-29 DIAGNOSIS — I872 Venous insufficiency (chronic) (peripheral): Secondary | ICD-10-CM

## 2024-04-29 DIAGNOSIS — L97821 Non-pressure chronic ulcer of other part of left lower leg limited to breakdown of skin: Secondary | ICD-10-CM | POA: Diagnosis not present

## 2024-04-29 MED ORDER — DOXYCYCLINE HYCLATE 100 MG PO TABS: 100.0000 mg | ORAL_TABLET | Freq: Two times a day (BID) | ORAL | 0 refills | Status: AC

## 2024-04-29 NOTE — Progress Notes (Signed)
 Subjective:    Patient ID: Jasmin [name], female    DOB: 05/16/1945, 78 y.o.   MRN: [id number]  patient had a sore that formed on her anterior left shin a few weeks ago.  It is approximately the size of a nickel.  It will not heal.  It is covered in a yellow crus she went to her pain management doctor a week ago and was diagnosed with cellulitis as her entire left leg from her mid shin to her foot with bright red erythematous, swollen and painful.  She was given Keflex  for 1 week.  The erythema is slightly better and the pain is slightly better although it still erythematous.  She does not have a history of MRSA.  She denies any fevers or chills. Past Medical History:  Diagnosis Date   Arthritis    CAD (coronary artery disease)    COPD (chronic obstructive pulmonary disease) (HCC)    Dyslipidemia    Fibromyalgia    GERD (gastroesophageal reflux disease)    HTN (hypertension)    Palpitations    Rheumatoid arthritis (HCC)    Thyroid  disease    Past Surgical History:  Procedure Laterality Date   BACK SURGERY     remote, no history for me to review.   PTCA     Current Outpatient Medications on File Prior to Visit  Medication Sig Dispense Refill   cephALEXin  (KEFLEX ) 500 MG capsule Take 1 capsule (500 mg total) by mouth 2 (two) times daily. 14 capsule 0   cloNIDine  (CATAPRES ) 0.1 MG tablet TAKE 1 TABLET BY MOUTH TWICE A DAY 180 tablet 3   clopidogrel  (PLAVIX ) 75 MG tablet Take 1 tablet (75 mg total) by mouth daily. 90 tablet 3   furosemide  (LASIX ) 40 MG tablet TAKE 1 TABLET (40 MG TOTAL) BY MOUTH DAILY AS NEEDED FOR FLUID. 90 tablet 1   gabapentin  (NEURONTIN ) 100 MG capsule TAKE 1 CAPSULE (100 MG TOTAL) BY MOUTH THREE TIMES DAILY. 270 capsule 0   KLOR-CON  M20 20 MEQ tablet TAKE 1 TABLET (20 MEQ TOTAL) BY MOUTH DAILY AS NEEDED (WITH FUROSEMIDE ). 90 tablet 1   liothyronine  (CYTOMEL ) 5 MCG tablet Take one tablet by mouth twice a day on an empty stomach. 180 tablet 3   lisinopril  (ZESTRIL )  40 MG tablet TAKE 1 TABLET BY MOUTH EVERY DAY 90 tablet 3   metoprolol  succinate (TOPROL -XL) 50 MG 24 hr tablet Take 1 tablet (50 mg total) by mouth daily. TAKE WITH OR IMMEDIATELY FOLLOWING A MEAL. 90 tablet 1   nitroGLYCERIN  (NITROSTAT ) 0.4 MG SL tablet Dissolve 1 tablet under the tongue every 5 minutes as needed for chest pain. Max of 3 doses, then 911. 25 tablet 6   oxyCODONE -acetaminophen  (PERCOCET) 10-325 MG tablet Take 2 tablets by mouth every 6 (six) hours as needed for pain. 240 tablet 0   Pancrelipase , Lip-Prot-Amyl, (ZENPEP ) 40000-126000 units CPEP Take 2 capsules (80,000 Units total) by mouth with breakfast, with lunch, and with evening meal. 540 capsule 3   predniSONE  (DELTASONE ) 1 MG tablet TAKE 1 TABLET BY MOUTH DAILY AS NEEDED. 30 tablet 3   propranolol  (INDERAL ) 10 MG tablet Take 1 tablet by mouth up to four times daily as needed for palpitations 360 tablet 3   QUTENZA , 4 PATCH, 8 %      No current facility-administered medications on file prior to visit.     Allergies  Allergen Reactions   Crestor [Rosuvastatin]    Social History   Socioeconomic History  Marital status: Married    Spouse name: Earnest   Number of children: 4   Years of education: Not on file   Highest education level: Not on file  Occupational History   Not on file  Tobacco Use   Smoking status: Every Day    Types: E-cigarettes    Last attempt to quit: 01/13/2009    Years since quitting: 15.3   Smokeless tobacco: Never  Vaping Use   Vaping status: Some Days   Substances: Nicotine  Substance and Sexual Activity   Alcohol use: No   Drug use: No   Sexual activity: Not Currently  Other Topics Concern   Not on file  Social History Narrative   Married in 1963.   4 grown children.   Social Drivers of Health   Tobacco Use: High Risk (04/21/2024)   Patient History    Smoking Tobacco Use: Every Day    Smokeless Tobacco Use: Never    Passive Exposure: Not on file  Financial Resource Strain:  Low Risk (11/04/2021)   Overall Financial Resource Strain (CARDIA)    Difficulty of Paying Living Expenses: Not hard at all  Food Insecurity: No Food Insecurity (11/04/2021)   Hunger Vital Sign    Worried About Running Out of Food in the Last Year: Never true    Ran Out of Food in the Last Year: Never true  Transportation Needs: No Transportation Needs (11/04/2021)   PRAPARE - Administrator, Civil Service (Medical): No    Lack of Transportation (Non-Medical): No  Physical Activity: Insufficiently Active (11/04/2021)   Exercise Vital Sign    Days of Exercise per Week: 5 days    Minutes of Exercise per Session: 10 min  Stress: No Stress Concern Present (11/04/2021)   [employer] of Occupational Health - Occupational Stress Questionnaire    Feeling of Stress : Not at all  Social Connections: Moderately Integrated (11/04/2021)   Social Connection and Isolation Panel    Frequency of Communication with Friends and Family: More than three times a week    Frequency of Social Gatherings with Friends and Family: Once a week    Attends Religious Services: 1 to 4 times per year    Active Member of [employer] or Organizations: No    Attends Banker Meetings: Never    Marital Status: Married  Catering Manager Violence: Not At Risk (11/04/2021)   Humiliation, Afraid, Rape, and Kick questionnaire    Fear of Current or Ex-Partner: No    Emotionally Abused: No    Physically Abused: No    Sexually Abused: No  Depression (PHQ2-9): Low Risk (02/19/2024)   Depression (PHQ2-9)    PHQ-2 Score: 2  Alcohol Screen: Low Risk (11/04/2021)   Alcohol Screen    Last Alcohol Screening Score (AUDIT): 0  Housing: Low Risk (11/04/2021)   Housing    Last Housing Risk Score: 0  Utilities: Not on file  Health Literacy: Not on file     Review of Systems  All other systems reviewed and are negative.      Objective:   Physical Exam Vitals reviewed.  Constitutional:      General: She is  not in acute distress.    Appearance: Normal appearance. She is normal weight. She is not ill-appearing or toxic-appearing.  Cardiovascular:     Rate and Rhythm: Normal rate and regular rhythm.     Pulses: Normal pulses.     Heart sounds: Normal heart sounds. No murmur heard.  No friction rub. No gallop.  Pulmonary:     Effort: Pulmonary effort is normal. No accessory muscle usage, respiratory distress or retractions.     Breath sounds: No stridor. No wheezing, rhonchi or rales.  Abdominal:     General: Bowel sounds are normal. There is no distension.     Palpations: Abdomen is soft. There is no mass.     Tenderness: There is no abdominal tenderness. There is no guarding.  Musculoskeletal:     Right lower leg: Edema present.     Left lower leg: Edema present.  Skin:    Findings: Erythema and lesion present.  Neurological:     Mental Status: She is alert.        Assessment & Plan:  Cellulitis of left lower extremity  Venous stasis ulcer of other part of left lower leg limited to breakdown of skin without varicose veins (HCC) Patient continues to suffer from cellulitis.  I will switch her to doxycycline  100 mg twice daily for 7 days to cover MRSA.  I believe that the lesion forming on her left leg is a venous stasis ulcer.  She has a history of this in the past on the right leg.  If the erythema has faded at her next visit I would recommend compression wraps with Unna boots every 3 days until the wound heals and then recommend compression hose to prevent this in the future.  Recheck on Monday

## 2024-05-05 ENCOUNTER — Encounter: Payer: Self-pay | Admitting: Family Medicine

## 2024-05-05 ENCOUNTER — Ambulatory Visit (INDEPENDENT_AMBULATORY_CARE_PROVIDER_SITE_OTHER): Admitting: Family Medicine

## 2024-05-05 VITALS — BP 148/82 | HR 63 | Temp 99.5°F | Ht 60.0 in | Wt 130.0 lb

## 2024-05-05 DIAGNOSIS — I83028 Varicose veins of left lower extremity with ulcer other part of lower leg: Secondary | ICD-10-CM | POA: Diagnosis not present

## 2024-05-05 DIAGNOSIS — L97821 Non-pressure chronic ulcer of other part of left lower leg limited to breakdown of skin: Secondary | ICD-10-CM | POA: Diagnosis not present

## 2024-05-05 NOTE — Progress Notes (Signed)
 "  Subjective:    Patient ID: Jasmin [name], female    DOB: 1945/11/12, 78 y.o.   MRN: [id number] 04/29/24  patient had a sore that formed on her anterior left shin a few weeks ago.  It is approximately the size of a nickel.  It will not heal.  It is covered in a yellow crus she went to her pain management doctor a week ago and was diagnosed with cellulitis as her entire left leg from her mid shin to her foot with bright red erythematous, swollen and painful.  She was given Keflex  for 1 week.  The erythema is slightly better and the pain is slightly better although it still erythematous.  She does not have a history of MRSA.  She denies any fevers or chills.  At that time, my plan was: Patient continues to suffer from cellulitis.  I will switch her to doxycycline  100 mg twice daily for 7 days to cover MRSA.  I believe that the lesion forming on her left leg is a venous stasis ulcer.  She has a history of this in the past on the right leg.  If the erythema has faded at her next visit I would recommend compression wraps with Unna boots every 3 days until the wound heals and then recommend compression hose to prevent this in the future.  Recheck on Monday  05/05/24  As seen above, the redness in her lower leg has improved.  The erythema has faded to a light pink.  It is no longer warm to the touch although the skin around the ulcer is tender to palpation.  The ulcer itself is approximately 13 mm in diameter. Past Medical History:  Diagnosis Date   Arthritis    CAD (coronary artery disease)    COPD (chronic obstructive pulmonary disease) (HCC)    Dyslipidemia    Fibromyalgia    GERD (gastroesophageal reflux disease)    HTN (hypertension)    Palpitations    Rheumatoid arthritis (HCC)    Thyroid  disease    Past Surgical History:  Procedure Laterality Date   BACK SURGERY     remote, no history for me to review.   PTCA     Current Outpatient Medications on File Prior to Visit  Medication Sig  Dispense Refill   cephALEXin  (KEFLEX ) 500 MG capsule Take 1 capsule (500 mg total) by mouth 2 (two) times daily. 14 capsule 0   cloNIDine  (CATAPRES ) 0.1 MG tablet TAKE 1 TABLET BY MOUTH TWICE A DAY 180 tablet 3   clopidogrel  (PLAVIX ) 75 MG tablet Take 1 tablet (75 mg total) by mouth daily. 90 tablet 3   doxycycline  (VIBRA -TABS) 100 MG tablet Take 1 tablet (100 mg total) by mouth 2 (two) times daily. 14 tablet 0   furosemide  (LASIX ) 40 MG tablet TAKE 1 TABLET (40 MG TOTAL) BY MOUTH DAILY AS NEEDED FOR FLUID. 90 tablet 1   gabapentin  (NEURONTIN ) 100 MG capsule TAKE 1 CAPSULE (100 MG TOTAL) BY MOUTH THREE TIMES DAILY. 270 capsule 0   KLOR-CON  M20 20 MEQ tablet TAKE 1 TABLET (20 MEQ TOTAL) BY MOUTH DAILY AS NEEDED (WITH FUROSEMIDE ). 90 tablet 1   liothyronine  (CYTOMEL ) 5 MCG tablet Take one tablet by mouth twice a day on an empty stomach. 180 tablet 3   lisinopril  (ZESTRIL ) 40 MG tablet TAKE 1 TABLET BY MOUTH EVERY DAY 90 tablet 3   metoprolol  succinate (TOPROL -XL) 50 MG 24 hr tablet Take 1 tablet (50 mg total) by mouth daily.  TAKE WITH OR IMMEDIATELY FOLLOWING A MEAL. 90 tablet 1   nitroGLYCERIN  (NITROSTAT ) 0.4 MG SL tablet Dissolve 1 tablet under the tongue every 5 minutes as needed for chest pain. Max of 3 doses, then 911. 25 tablet 6   oxyCODONE -acetaminophen  (PERCOCET) 10-325 MG tablet Take 2 tablets by mouth every 6 (six) hours as needed for pain. 240 tablet 0   Pancrelipase , Lip-Prot-Amyl, (ZENPEP ) 40000-126000 units CPEP Take 2 capsules (80,000 Units total) by mouth with breakfast, with lunch, and with evening meal. 540 capsule 3   predniSONE  (DELTASONE ) 1 MG tablet TAKE 1 TABLET BY MOUTH DAILY AS NEEDED. 30 tablet 3   propranolol  (INDERAL ) 10 MG tablet Take 1 tablet by mouth up to four times daily as needed for palpitations 360 tablet 3   QUTENZA , 4 PATCH, 8 %      No current facility-administered medications on file prior to visit.     Allergies  Allergen Reactions   Crestor  [Rosuvastatin]    Social History   Socioeconomic History   Marital status: Married    Spouse name: Earnest   Number of children: 4   Years of education: Not on file   Highest education level: Not on file  Occupational History   Not on file  Tobacco Use   Smoking status: Every Day    Types: E-cigarettes    Last attempt to quit: 01/13/2009    Years since quitting: 15.3   Smokeless tobacco: Never  Vaping Use   Vaping status: Some Days   Substances: Nicotine  Substance and Sexual Activity   Alcohol use: No   Drug use: No   Sexual activity: Not Currently  Other Topics Concern   Not on file  Social History Narrative   Married in 1963.   4 grown children.   Social Drivers of Health   Tobacco Use: High Risk (05/05/2024)   Patient History    Smoking Tobacco Use: Every Day    Smokeless Tobacco Use: Never    Passive Exposure: Not on file  Financial Resource Strain: Low Risk (11/04/2021)   Overall Financial Resource Strain (CARDIA)    Difficulty of Paying Living Expenses: Not hard at all  Food Insecurity: No Food Insecurity (11/04/2021)   Hunger Vital Sign    Worried About Running Out of Food in the Last Year: Never true    Ran Out of Food in the Last Year: Never true  Transportation Needs: No Transportation Needs (11/04/2021)   PRAPARE - Administrator, Civil Service (Medical): No    Lack of Transportation (Non-Medical): No  Physical Activity: Insufficiently Active (11/04/2021)   Exercise Vital Sign    Days of Exercise per Week: 5 days    Minutes of Exercise per Session: 10 min  Stress: No Stress Concern Present (11/04/2021)   [employer] of Occupational Health - Occupational Stress Questionnaire    Feeling of Stress : Not at all  Social Connections: Moderately Integrated (11/04/2021)   Social Connection and Isolation Panel    Frequency of Communication with Friends and Family: More than three times a week    Frequency of Social Gatherings with Friends and  Family: Once a week    Attends Religious Services: 1 to 4 times per year    Active Member of [employer] or Organizations: No    Attends Banker Meetings: Never    Marital Status: Married  Catering Manager Violence: Not At Risk (11/04/2021)   Humiliation, Afraid, Rape, and Kick questionnaire  Fear of Current or Ex-Partner: No    Emotionally Abused: No    Physically Abused: No    Sexually Abused: No  Depression (PHQ2-9): Low Risk (05/05/2024)   Depression (PHQ2-9)    PHQ-2 Score: 0  Alcohol Screen: Low Risk (11/04/2021)   Alcohol Screen    Last Alcohol Screening Score (AUDIT): 0  Housing: Low Risk (11/04/2021)   Housing    Last Housing Risk Score: 0  Utilities: Not on file  Health Literacy: Not on file     Review of Systems  All other systems reviewed and are negative.      Objective:   Physical Exam Vitals reviewed.  Constitutional:      General: She is not in acute distress.    Appearance: Normal appearance. She is normal weight. She is not ill-appearing or toxic-appearing.  Cardiovascular:     Rate and Rhythm: Normal rate and regular rhythm.     Pulses: Normal pulses.     Heart sounds: Normal heart sounds. No murmur heard.    No friction rub. No gallop.  Pulmonary:     Effort: Pulmonary effort is normal. No accessory muscle usage, respiratory distress or retractions.     Breath sounds: No stridor. No wheezing, rhonchi or rales.  Abdominal:     General: Bowel sounds are normal. There is no distension.     Palpations: Abdomen is soft. There is no mass.     Tenderness: There is no abdominal tenderness. There is no guarding.  Musculoskeletal:     Right lower leg: Edema present.     Left lower leg: Edema present.  Skin:    Findings: Erythema and lesion present.  Neurological:     Mental Status: She is alert.        Assessment & Plan:  Venous stasis ulcer of other part of left lower leg limited to breakdown of skin with varicose veins (HCC) I believe  this is a venous stasis ulcer due to chronic venous insufficiency.  I believe the secondary infection has improved.  Therefore I placed the patient in an Radio broadcast assistant.  She will return in 3 days for dressing change.  Continue Unna boot dressing changes until the wound heals.  If not healing, consider a biopsy to rule out malignancy in the skin. "

## 2024-05-09 ENCOUNTER — Encounter: Payer: Self-pay | Admitting: Family Medicine

## 2024-05-09 ENCOUNTER — Ambulatory Visit: Admitting: Family Medicine

## 2024-05-09 VITALS — BP 164/78 | HR 63 | Temp 97.5°F | Ht 60.0 in | Wt 130.0 lb

## 2024-05-09 DIAGNOSIS — L97821 Non-pressure chronic ulcer of other part of left lower leg limited to breakdown of skin: Secondary | ICD-10-CM | POA: Diagnosis not present

## 2024-05-09 DIAGNOSIS — I83028 Varicose veins of left lower extremity with ulcer other part of lower leg: Secondary | ICD-10-CM | POA: Diagnosis not present

## 2024-05-09 NOTE — Progress Notes (Addendum)
 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 Bruising likely due to Plavix . - Follow up with Dr Duanne next week for further evaluation and management.  General Health Maintenance Received flu shot for current season.      Return if symptoms worsen or fail to improve.   Subjective:    Patient ID: Jasmin [name], female    DOB: Jul 31, 1945  Age: 78 y.o. MRN: [id number]  Chief Complaint  Patient presents with   Follow-up    Took off bandage last night, stated it was swollen and hurts really bad. Ulcer looks better.    HPI Discussed the use of AI scribe software for clinical note transcription with the patient, who gave verbal consent to proceed.  History of Present Illness        History of Present Illness Jasmin [name] is a 78 year old female with venous ulcers who presents with lower left leg pain and swelling.  She experiences significant discomfort due to swelling and pain in her leg, describing it as 'very sore' and located on the bone. The swelling was initially severe enough to cause her leg to hang over the bandage but it has improved by about 70% since the initial presentation 3 days ago. The wound is still draining slightly, but she has completed her course of antibiotics several days ago. No fever or chills are present. Patient removed the Unna boot due to discomfort and swelling and does not want another one at this time  She experiences significant pain that worsens when her pain medication wears off. She is currently on Plavix  and occasionally takes aspirin. She has a history of venous ulcers, previously on the right leg, which healed with bandaging. However, the  current ulcer became infected despite using Neosporin.  She reports high levels of stress due to personal circumstances, including managing her husband's Alzheimer's disease and recent issues with her water supply. She also mentions difficulty sleeping due to general discomfort and joint pain, describing her joints as 'bone on bone'.  She smokes cigarettes and expresses no intention to quit, stating it helps her cope with stress and pain. She denies having diabetes.  Physical Exam SKIN: Lesion on the head.  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 Bruising likely due to Plavix . - Follow up with Dr Duanne next week for further evaluation and management.  General Health Maintenance Received flu shot for current season.    The 10-year ASCVD risk score (Arnett DK, et al., 2019) is: 58.4%  Past Medical History:  Diagnosis Date   Arthritis    CAD (coronary artery disease)    COPD (chronic obstructive pulmonary disease) (HCC)    Dyslipidemia    Fibromyalgia    GERD (gastroesophageal reflux disease)    HTN (hypertension)    Palpitations    Rheumatoid arthritis (HCC)    Thyroid  disease    Past Surgical History:  Procedure Laterality Date   BACK SURGERY     remote, no history for me to review.   PTCA     Social History[1] Family  History  Problem Relation Age of Onset   Coronary artery disease Father    Coronary artery disease Mother    Heart attack Brother    Coronary artery disease Sister    Allergies[2]  ROS    Objective:    BP (!) 164/78   Pulse 63   Temp (!) 97.5 F (36.4 C)   Ht 5' (1.524 m)   Wt 130 lb (59 kg)   SpO2 99%   BMI 25.39 kg/m  BP Readings from Last 3 Encounters:  05/09/24 (!) 164/78  05/05/24 (!) 148/82  04/29/24 (!) 160/80   Wt Readings from Last 3 Encounters:  05/09/24 130 lb (59 kg)   05/05/24 130 lb (59 kg)  04/29/24 129 lb (58.5 kg)    Physical Exam Vitals and nursing note reviewed.  Constitutional:      General: She is not in acute distress.    Appearance: Normal appearance.  HENT:     Head: Normocephalic.     Right Ear: Tympanic membrane, ear canal and external ear normal.     Left Ear: Tympanic membrane, ear canal and external ear normal.  Eyes:     Extraocular Movements: Extraocular movements intact.     Conjunctiva/sclera: Conjunctivae normal.     Pupils: Pupils are equal, round, and reactive to light.  Cardiovascular:     Rate and Rhythm: Normal rate and regular rhythm.     Heart sounds: Normal heart sounds.  Pulmonary:     Effort: Pulmonary effort is normal.     Breath sounds: Normal breath sounds.  Musculoskeletal:     Right lower leg: No edema.     Left lower leg: Edema present.  Skin:    Findings: Bruising, erythema and lesion present.     Comments: Patient has removed bandage- left anterior shin with swelling/bruising, ulcer with minimal tenderness and drainage noted. Has improved per patient/CMA  Neurological:     General: No focal deficit present.     Mental Status: She is alert and oriented to person, place, and time.  Psychiatric:        Behavior: Behavior normal.        Thought Content: Thought content normal.      No results found for any visits on 05/09/24.          [1]  Social History Tobacco Use   Smoking status: Every Day    Types: E-cigarettes    Last attempt to quit: 01/13/2009    Years since quitting: 15.3   Smokeless tobacco: Never  Vaping Use   Vaping status: Some Days   Substances: Nicotine  Substance Use Topics   Alcohol use: No   Drug use: No  [2]  Allergies Allergen Reactions   Crestor [Rosuvastatin]    "

## 2024-05-12 ENCOUNTER — Encounter: Payer: Self-pay | Admitting: Family Medicine

## 2024-05-12 ENCOUNTER — Ambulatory Visit (INDEPENDENT_AMBULATORY_CARE_PROVIDER_SITE_OTHER): Admitting: Family Medicine

## 2024-05-12 VITALS — BP 158/80 | HR 68 | Temp 98.0°F | Ht 60.0 in | Wt 129.4 lb

## 2024-05-12 DIAGNOSIS — I83028 Varicose veins of left lower extremity with ulcer other part of lower leg: Secondary | ICD-10-CM

## 2024-05-12 DIAGNOSIS — L03116 Cellulitis of left lower limb: Secondary | ICD-10-CM | POA: Diagnosis not present

## 2024-05-12 DIAGNOSIS — L97821 Non-pressure chronic ulcer of other part of left lower leg limited to breakdown of skin: Secondary | ICD-10-CM | POA: Diagnosis not present

## 2024-05-12 MED ORDER — CEPHALEXIN 500 MG PO CAPS: 500.0000 mg | ORAL_CAPSULE | Freq: Three times a day (TID) | ORAL | 0 refills | Status: AC

## 2024-05-12 NOTE — Progress Notes (Signed)
 "  Subjective:    Patient ID: Jasmin [name], female    DOB: 07-Oct-1945, 78 y.o.   MRN: [id number] 04/29/24  patient had a sore that formed on her anterior left shin a few weeks ago.  It is approximately the size of a nickel.  It will not heal.  It is covered in a yellow crus she went to her pain management doctor a week ago and was diagnosed with cellulitis as her entire left leg from her mid shin to her foot with bright red erythematous, swollen and painful.  She was given Keflex  for 1 week.  The erythema is slightly better and the pain is slightly better although it still erythematous.  She does not have a history of MRSA.  She denies any fevers or chills.  At that time, my plan was: Patient continues to suffer from cellulitis.  I will switch her to doxycycline  100 mg twice daily for 7 days to cover MRSA.  I believe that the lesion forming on her left leg is a venous stasis ulcer.  She has a history of this in the past on the right leg.  If the erythema has faded at her next visit I would recommend compression wraps with Unna boots every 3 days until the wound heals and then recommend compression hose to prevent this in the future.  Recheck on Monday  05/05/24  As seen above, the redness in her lower leg has improved.  The erythema has faded to a light pink.  It is no longer warm to the touch although the skin around the ulcer is tender to palpation.  The ulcer itself is approximately 13 mm in diameter.  At that time, my plan was:  I believe this is a venous stasis ulcer due to chronic venous insufficiency.  I believe the secondary infection has improved.  Therefore I placed the patient in an Radio broadcast assistant.  She will return in 3 days for dressing change.  Continue Unna boot dressing changes until the wound heals.  If not healing, consider a biopsy to rule out malignancy in the skin.  05/12/24 Patient had to remove the Unna boot after 48 hours due to swelling in her legs and discomfort.  However she  states that the ulcer had closed over.  She rewrapped this herself and when the nurse remove the dressing on Friday, she suffered a laceration from the scissors.  She has been out of the dressing now Saturday Sunday and Monday and the wound has opened back up and she has a 13 mm venous stasis ulcer on the anterior left shin.  The skin around it has become erythematous warm and painful again. Past Medical History:  Diagnosis Date   Arthritis    CAD (coronary artery disease)    COPD (chronic obstructive pulmonary disease) (HCC)    Dyslipidemia    Fibromyalgia    GERD (gastroesophageal reflux disease)    HTN (hypertension)    Palpitations    Rheumatoid arthritis (HCC)    Thyroid  disease    Past Surgical History:  Procedure Laterality Date   BACK SURGERY     remote, no history for me to review.   PTCA     Current Outpatient Medications on File Prior to Visit  Medication Sig Dispense Refill   cloNIDine  (CATAPRES ) 0.1 MG tablet TAKE 1 TABLET BY MOUTH TWICE A DAY 180 tablet 3   clopidogrel  (PLAVIX ) 75 MG tablet Take 1 tablet (75 mg total) by mouth daily. 90 tablet  3   doxycycline  (VIBRA -TABS) 100 MG tablet Take 1 tablet (100 mg total) by mouth 2 (two) times daily. 14 tablet 0   furosemide  (LASIX ) 40 MG tablet TAKE 1 TABLET (40 MG TOTAL) BY MOUTH DAILY AS NEEDED FOR FLUID. 90 tablet 1   gabapentin  (NEURONTIN ) 100 MG capsule TAKE 1 CAPSULE (100 MG TOTAL) BY MOUTH THREE TIMES DAILY. 270 capsule 0   KLOR-CON  M20 20 MEQ tablet TAKE 1 TABLET (20 MEQ TOTAL) BY MOUTH DAILY AS NEEDED (WITH FUROSEMIDE ). 90 tablet 1   liothyronine  (CYTOMEL ) 5 MCG tablet Take one tablet by mouth twice a day on an empty stomach. 180 tablet 3   lisinopril  (ZESTRIL ) 40 MG tablet TAKE 1 TABLET BY MOUTH EVERY DAY 90 tablet 3   metoprolol  succinate (TOPROL -XL) 50 MG 24 hr tablet Take 1 tablet (50 mg total) by mouth daily. TAKE WITH OR IMMEDIATELY FOLLOWING A MEAL. 90 tablet 1   nitroGLYCERIN  (NITROSTAT ) 0.4 MG SL tablet  Dissolve 1 tablet under the tongue every 5 minutes as needed for chest pain. Max of 3 doses, then 911. 25 tablet 6   oxyCODONE -acetaminophen  (PERCOCET) 10-325 MG tablet Take 2 tablets by mouth every 6 (six) hours as needed for pain. 240 tablet 0   Pancrelipase , Lip-Prot-Amyl, (ZENPEP ) 40000-126000 units CPEP Take 2 capsules (80,000 Units total) by mouth with breakfast, with lunch, and with evening meal. 540 capsule 3   predniSONE  (DELTASONE ) 1 MG tablet TAKE 1 TABLET BY MOUTH DAILY AS NEEDED. 30 tablet 3   propranolol  (INDERAL ) 10 MG tablet Take 1 tablet by mouth up to four times daily as needed for palpitations 360 tablet 3   QUTENZA , 4 PATCH, 8 %      No current facility-administered medications on file prior to visit.     Allergies  Allergen Reactions   Crestor [Rosuvastatin]    Social History   Socioeconomic History   Marital status: Married    Spouse name: Earnest   Number of children: 4   Years of education: Not on file   Highest education level: Not on file  Occupational History   Not on file  Tobacco Use   Smoking status: Every Day    Types: E-cigarettes    Last attempt to quit: 01/13/2009    Years since quitting: 15.3   Smokeless tobacco: Never  Vaping Use   Vaping status: Some Days   Substances: Nicotine  Substance and Sexual Activity   Alcohol use: No   Drug use: No   Sexual activity: Not Currently  Other Topics Concern   Not on file  Social History Narrative   Married in 1963.   4 grown children.   Social Drivers of Health   Tobacco Use: High Risk (05/12/2024)   Patient History    Smoking Tobacco Use: Every Day    Smokeless Tobacco Use: Never    Passive Exposure: Not on file  Financial Resource Strain: Low Risk (11/04/2021)   Overall Financial Resource Strain (CARDIA)    Difficulty of Paying Living Expenses: Not hard at all  Food Insecurity: No Food Insecurity (11/04/2021)   Hunger Vital Sign    Worried About Running Out of Food in the Last Year: Never  true    Ran Out of Food in the Last Year: Never true  Transportation Needs: No Transportation Needs (11/04/2021)   PRAPARE - Administrator, Civil Service (Medical): No    Lack of Transportation (Non-Medical): No  Physical Activity: Insufficiently Active (11/04/2021)   Exercise  Vital Sign    Days of Exercise per Week: 5 days    Minutes of Exercise per Session: 10 min  Stress: No Stress Concern Present (11/04/2021)   [employer] of Occupational Health - Occupational Stress Questionnaire    Feeling of Stress : Not at all  Social Connections: Moderately Integrated (11/04/2021)   Social Connection and Isolation Panel    Frequency of Communication with Friends and Family: More than three times a week    Frequency of Social Gatherings with Friends and Family: Once a week    Attends Religious Services: 1 to 4 times per year    Active Member of [employer] or Organizations: No    Attends Banker Meetings: Never    Marital Status: Married  Catering Manager Violence: Not At Risk (11/04/2021)   Humiliation, Afraid, Rape, and Kick questionnaire    Fear of Current or Ex-Partner: No    Emotionally Abused: No    Physically Abused: No    Sexually Abused: No  Depression (PHQ2-9): Low Risk (05/12/2024)   Depression (PHQ2-9)    PHQ-2 Score: 0  Alcohol Screen: Low Risk (11/04/2021)   Alcohol Screen    Last Alcohol Screening Score (AUDIT): 0  Housing: Low Risk (11/04/2021)   Housing    Last Housing Risk Score: 0  Utilities: Not on file  Health Literacy: Not on file     Review of Systems  All other systems reviewed and are negative.      Objective:   Physical Exam Vitals reviewed.  Constitutional:      General: She is not in acute distress.    Appearance: Normal appearance. She is normal weight. She is not ill-appearing or toxic-appearing.  Cardiovascular:     Rate and Rhythm: Normal rate and regular rhythm.     Pulses: Normal pulses.     Heart sounds: Normal heart  sounds. No murmur heard.    No friction rub. No gallop.  Pulmonary:     Effort: Pulmonary effort is normal. No accessory muscle usage, respiratory distress or retractions.     Breath sounds: No stridor. No wheezing, rhonchi or rales.  Abdominal:     General: Bowel sounds are normal. There is no distension.     Palpations: Abdomen is soft. There is no mass.     Tenderness: There is no abdominal tenderness. There is no guarding.  Musculoskeletal:     Right lower leg: Edema present.     Left lower leg: Edema present.  Skin:    Findings: Erythema and lesion present.  Neurological:     Mental Status: She is alert.        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 3 times daily for 7 days.  I replaced the Unna boot on the patient's left leg and plan to see her back on Friday.  Hopefully with compression therapy the venous stasis ulcer will heal over time and we can transition the support hose "

## 2024-05-16 ENCOUNTER — Encounter: Payer: Self-pay | Admitting: Family Medicine

## 2024-05-16 ENCOUNTER — Ambulatory Visit: Admitting: Family Medicine

## 2024-05-16 VITALS — BP 120/72 | HR 65 | Temp 98.5°F | Ht 60.0 in | Wt 130.0 lb

## 2024-05-16 DIAGNOSIS — L97821 Non-pressure chronic ulcer of other part of left lower leg limited to breakdown of skin: Secondary | ICD-10-CM | POA: Diagnosis not present

## 2024-05-16 DIAGNOSIS — I83028 Varicose veins of left lower extremity with ulcer other part of lower leg: Secondary | ICD-10-CM | POA: Diagnosis not present

## 2024-05-16 NOTE — Progress Notes (Signed)
 "  Subjective:    Patient ID: Jasmin [name], female    DOB: [birth year], 79 y.o.   MRN: [id number] 04/29/24  patient had a sore that formed on her anterior left shin a few weeks ago.  It is approximately the size of a nickel.  It will not heal.  It is covered in a yellow crus she went to her pain management doctor a week ago and was diagnosed with cellulitis as her entire left leg from her mid shin to her foot with bright red erythematous, swollen and painful.  She was given Keflex  for 1 week.  The erythema is slightly better and the pain is slightly better although it still erythematous.  She does not have a history of MRSA.  She denies any fevers or chills.  At that time, my plan was: Patient continues to suffer from cellulitis.  I will switch her to doxycycline  100 mg twice daily for 7 days to cover MRSA.  I believe that the lesion forming on her left leg is a venous stasis ulcer.  She has a history of this in the past on the right leg.  If the erythema has faded at her next visit I would recommend compression wraps with Unna boots every 3 days until the wound heals and then recommend compression hose to prevent this in the future.  Recheck on Monday  05/05/24  As seen above, the redness in her lower leg has improved.  The erythema has faded to a light pink.  It is no longer warm to the touch although the skin around the ulcer is tender to palpation.  The ulcer itself is approximately 13 mm in diameter.  At that time, my plan was:  I believe this is a venous stasis ulcer due to chronic venous insufficiency.  I believe the secondary infection has improved.  Therefore I placed the patient in an Radio broadcast assistant.  She will return in 3 days for dressing change.  Continue Unna boot dressing changes until the wound heals.  If not healing, consider a biopsy to rule out malignancy in the skin.  05/12/24 Patient had to remove the Unna boot after 48 hours due to swelling in her legs and discomfort.  However she  states that the ulcer had closed over.  She rewrapped this herself and when the nurse remove the dressing on Friday, she suffered a laceration from the scissors.  She has been out of the dressing now Saturday Sunday and Monday and the wound has opened back up and she has a 13 mm venous stasis ulcer on the anterior left shin.  The skin around it has become erythematous warm and painful again.  At that time, my plan was:  Cover possible cellulitis with Keflex  500 mg p.o. 3 times daily for 7 days.  I replaced the Unna boot on the patient's left leg and plan to see her back on Friday.  Hopefully with compression therapy the venous stasis ulcer will heal over time and we can transition the support hose  05/16/24  As shown in the photograph above, the primary ulcer is starting to heal in with good granulation tissue.  The wound is now very shallow although not completely filled in.  However there is macerated skin superior and medial to the original ulcer where the skin has broken down as shown above.  The erythema has faded and the patient reports that the pain is much better. Past Medical History:  Diagnosis Date   Arthritis  CAD (coronary artery disease)    COPD (chronic obstructive pulmonary disease) (HCC)    Dyslipidemia    Fibromyalgia    GERD (gastroesophageal reflux disease)    HTN (hypertension)    Palpitations    Rheumatoid arthritis (HCC)    Thyroid  disease    Past Surgical History:  Procedure Laterality Date   BACK SURGERY     remote, no history for me to review.   PTCA     Current Outpatient Medications on File Prior to Visit  Medication Sig Dispense Refill   cephALEXin  (KEFLEX ) 500 MG capsule Take 1 capsule (500 mg total) by mouth 3 (three) times daily. 21 capsule 0   cloNIDine  (CATAPRES ) 0.1 MG tablet TAKE 1 TABLET BY MOUTH TWICE A DAY 180 tablet 3   clopidogrel  (PLAVIX ) 75 MG tablet Take 1 tablet (75 mg total) by mouth daily. 90 tablet 3   doxycycline  (VIBRA -TABS) 100 MG  tablet Take 1 tablet (100 mg total) by mouth 2 (two) times daily. 14 tablet 0   furosemide  (LASIX ) 40 MG tablet TAKE 1 TABLET (40 MG TOTAL) BY MOUTH DAILY AS NEEDED FOR FLUID. 90 tablet 1   gabapentin  (NEURONTIN ) 100 MG capsule TAKE 1 CAPSULE (100 MG TOTAL) BY MOUTH THREE TIMES DAILY. 270 capsule 0   KLOR-CON  M20 20 MEQ tablet TAKE 1 TABLET (20 MEQ TOTAL) BY MOUTH DAILY AS NEEDED (WITH FUROSEMIDE ). 90 tablet 1   liothyronine  (CYTOMEL ) 5 MCG tablet Take one tablet by mouth twice a day on an empty stomach. 180 tablet 3   lisinopril  (ZESTRIL ) 40 MG tablet TAKE 1 TABLET BY MOUTH EVERY DAY 90 tablet 3   metoprolol  succinate (TOPROL -XL) 50 MG 24 hr tablet Take 1 tablet (50 mg total) by mouth daily. TAKE WITH OR IMMEDIATELY FOLLOWING A MEAL. 90 tablet 1   nitroGLYCERIN  (NITROSTAT ) 0.4 MG SL tablet Dissolve 1 tablet under the tongue every 5 minutes as needed for chest pain. Max of 3 doses, then 911. 25 tablet 6   oxyCODONE -acetaminophen  (PERCOCET) 10-325 MG tablet Take 2 tablets by mouth every 6 (six) hours as needed for pain. 240 tablet 0   Pancrelipase , Lip-Prot-Amyl, (ZENPEP ) 40000-126000 units CPEP Take 2 capsules (80,000 Units total) by mouth with breakfast, with lunch, and with evening meal. 540 capsule 3   predniSONE  (DELTASONE ) 1 MG tablet TAKE 1 TABLET BY MOUTH DAILY AS NEEDED. 30 tablet 3   propranolol  (INDERAL ) 10 MG tablet Take 1 tablet by mouth up to four times daily as needed for palpitations 360 tablet 3   QUTENZA , 4 PATCH, 8 %      No current facility-administered medications on file prior to visit.     Allergies  Allergen Reactions   Crestor [Rosuvastatin]    Social History   Socioeconomic History   Marital status: Married    Spouse name: Earnest   Number of children: 4   Years of education: Not on file   Highest education level: Not on file  Occupational History   Not on file  Tobacco Use   Smoking status: Every Day    Types: E-cigarettes    Last attempt to quit: 01/13/2009     Years since quitting: 15.3   Smokeless tobacco: Never  Vaping Use   Vaping status: Some Days   Substances: Nicotine  Substance and Sexual Activity   Alcohol use: No   Drug use: No   Sexual activity: Not Currently  Other Topics Concern   Not on file  Social History Narrative  Married in 1963.   4 grown children.   Social Drivers of Health   Tobacco Use: High Risk (05/12/2024)   Patient History    Smoking Tobacco Use: Every Day    Smokeless Tobacco Use: Never    Passive Exposure: Not on file  Financial Resource Strain: Low Risk (11/04/2021)   Overall Financial Resource Strain (CARDIA)    Difficulty of Paying Living Expenses: Not hard at all  Food Insecurity: No Food Insecurity (11/04/2021)   Hunger Vital Sign    Worried About Running Out of Food in the Last Year: Never true    Ran Out of Food in the Last Year: Never true  Transportation Needs: No Transportation Needs (11/04/2021)   PRAPARE - Administrator, Civil Service (Medical): No    Lack of Transportation (Non-Medical): No  Physical Activity: Insufficiently Active (11/04/2021)   Exercise Vital Sign    Days of Exercise per Week: 5 days    Minutes of Exercise per Session: 10 min  Stress: No Stress Concern Present (11/04/2021)   [employer] of Occupational Health - Occupational Stress Questionnaire    Feeling of Stress : Not at all  Social Connections: Moderately Integrated (11/04/2021)   Social Connection and Isolation Panel    Frequency of Communication with Friends and Family: More than three times a week    Frequency of Social Gatherings with Friends and Family: Once a week    Attends Religious Services: 1 to 4 times per year    Active Member of [employer] or Organizations: No    Attends Banker Meetings: Never    Marital Status: Married  Catering Manager Violence: Not At Risk (11/04/2021)   Humiliation, Afraid, Rape, and Kick questionnaire    Fear of Current or Ex-Partner: No     Emotionally Abused: No    Physically Abused: No    Sexually Abused: No  Depression (PHQ2-9): Low Risk (05/12/2024)   Depression (PHQ2-9)    PHQ-2 Score: 0  Alcohol Screen: Low Risk (11/04/2021)   Alcohol Screen    Last Alcohol Screening Score (AUDIT): 0  Housing: Low Risk (11/04/2021)   Housing    Last Housing Risk Score: 0  Utilities: Not on file  Health Literacy: Not on file     Review of Systems  All other systems reviewed and are negative.      Objective:   Physical Exam Vitals reviewed.  Constitutional:      General: She is not in acute distress.    Appearance: Normal appearance. She is normal weight. She is not ill-appearing or toxic-appearing.  Cardiovascular:     Rate and Rhythm: Normal rate and regular rhythm.     Pulses: Normal pulses.     Heart sounds: Normal heart sounds. No murmur heard.    No friction rub. No gallop.  Pulmonary:     Effort: Pulmonary effort is normal. No accessory muscle usage, respiratory distress or retractions.     Breath sounds: No stridor. No wheezing, rhonchi or rales.  Abdominal:     General: Bowel sounds are normal. There is no distension.     Palpations: Abdomen is soft. There is no mass.     Tenderness: There is no abdominal tenderness. There is no guarding.  Musculoskeletal:     Right lower leg: Edema present.     Left lower leg: Edema present.  Skin:    Findings: Erythema and lesion present.  Neurological:     Mental Status: She is alert.  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  I then reapplied an Unna boot to the left leg.  I recommended the patient get zip up compression hose 15 to 20 mmHg and strength and bring them with her to her follow-up appointment on Monday or Tuesday.  At that time hopefully we can remove the Unna boot and switch to  compression hose to allow the wounds to heal completely. "

## 2024-05-19 ENCOUNTER — Other Ambulatory Visit: Payer: Self-pay | Admitting: Family Medicine

## 2024-05-20 ENCOUNTER — Ambulatory Visit (INDEPENDENT_AMBULATORY_CARE_PROVIDER_SITE_OTHER): Admitting: Family Medicine

## 2024-05-20 ENCOUNTER — Encounter: Payer: Self-pay | Admitting: Family Medicine

## 2024-05-20 VITALS — BP 136/72 | HR 74 | Temp 98.5°F | Ht 60.0 in | Wt 129.6 lb

## 2024-05-20 DIAGNOSIS — L97821 Non-pressure chronic ulcer of other part of left lower leg limited to breakdown of skin: Secondary | ICD-10-CM

## 2024-05-20 DIAGNOSIS — I83028 Varicose veins of left lower extremity with ulcer other part of lower leg: Secondary | ICD-10-CM

## 2024-05-20 NOTE — Progress Notes (Signed)
 "  Subjective:    Patient ID: Jasmin [name], female    DOB: February 03, 1946, 79 y.o.   MRN: [id number] 04/29/24  patient had a sore that formed on her anterior left shin a few weeks ago.  It is approximately the size of a nickel.  It will not heal.  It is covered in a yellow crus she went to her pain management doctor a week ago and was diagnosed with cellulitis as her entire left leg from her mid shin to her foot with bright red erythematous, swollen and painful.  She was given Keflex  for 1 week.  The erythema is slightly better and the pain is slightly better although it still erythematous.  She does not have a history of MRSA.  She denies any fevers or chills.  At that time, my plan was: Patient continues to suffer from cellulitis.  I will switch her to doxycycline  100 mg twice daily for 7 days to cover MRSA.  I believe that the lesion forming on her left leg is a venous stasis ulcer.  She has a history of this in the past on the right leg.  If the erythema has faded at her next visit I would recommend compression wraps with Unna boots every 3 days until the wound heals and then recommend compression hose to prevent this in the future.  Recheck on Monday  05/05/24  As seen above, the redness in her lower leg has improved.  The erythema has faded to a light pink.  It is no longer warm to the touch although the skin around the ulcer is tender to palpation.  The ulcer itself is approximately 13 mm in diameter.  At that time, my plan was:  I believe this is a venous stasis ulcer due to chronic venous insufficiency.  I believe the secondary infection has improved.  Therefore I placed the patient in an Radio broadcast assistant.  She will return in 3 days for dressing change.  Continue Unna boot dressing changes until the wound heals.  If not healing, consider a biopsy to rule out malignancy in the skin.  05/12/24 Patient had to remove the Unna boot after 48 hours due to swelling in her legs and discomfort.  However she  states that the ulcer had closed over.  She rewrapped this herself and when the nurse remove the dressing on Friday, she suffered a laceration from the scissors.  She has been out of the dressing now Saturday Sunday and Monday and the wound has opened back up and she has a 13 mm venous stasis ulcer on the anterior left shin.  The skin around it has become erythematous warm and painful again.  At that time, my plan was:  Cover possible cellulitis with Keflex  500 mg p.o. 3 times daily for 7 days.  I replaced the Unna boot on the patient's left leg and plan to see her back on Friday.  Hopefully with compression therapy the venous stasis ulcer will heal over time and we can transition the support hose  05/16/24  As shown in the photograph above, the primary ulcer is starting to heal in with good granulation tissue.  The wound is now very shallow although not completely filled in.  However there is macerated skin superior and medial to the original ulcer where the skin has broken down as shown above.  The erythema has faded and the patient reports that the pain is much better.  05/21/23 The macerated skin medial and superior to the  original ulcer has now healed.  The ulcer itself is smaller.  There is a peripheral rim of healthy red granulation tissue completely encompassing the entire ulcer.  There is no evidence of any secondary cellulitis.  The erythema has faded. Past Medical History:  Diagnosis Date   Arthritis    CAD (coronary artery disease)    COPD (chronic obstructive pulmonary disease) (HCC)    Dyslipidemia    Fibromyalgia    GERD (gastroesophageal reflux disease)    HTN (hypertension)    Palpitations    Rheumatoid arthritis (HCC)    Thyroid  disease    Past Surgical History:  Procedure Laterality Date   BACK SURGERY     remote, no history for me to review.   PTCA     Current Outpatient Medications on File Prior to Visit  Medication Sig Dispense Refill   cloNIDine  (CATAPRES ) 0.1 MG  tablet TAKE 1 TABLET BY MOUTH TWICE A DAY 180 tablet 3   clopidogrel  (PLAVIX ) 75 MG tablet Take 1 tablet (75 mg total) by mouth daily. 90 tablet 3   doxycycline  (VIBRA -TABS) 100 MG tablet Take 1 tablet (100 mg total) by mouth 2 (two) times daily. 14 tablet 0   furosemide  (LASIX ) 40 MG tablet TAKE 1 TABLET (40 MG TOTAL) BY MOUTH DAILY AS NEEDED FOR FLUID. 90 tablet 1   KLOR-CON  M20 20 MEQ tablet TAKE 1 TABLET (20 MEQ TOTAL) BY MOUTH DAILY AS NEEDED (WITH FUROSEMIDE ). 90 tablet 1   liothyronine  (CYTOMEL ) 5 MCG tablet Take one tablet by mouth twice a day on an empty stomach. 180 tablet 3   lisinopril  (ZESTRIL ) 40 MG tablet TAKE 1 TABLET BY MOUTH EVERY DAY 90 tablet 3   metoprolol  succinate (TOPROL -XL) 50 MG 24 hr tablet Take 1 tablet (50 mg total) by mouth daily. TAKE WITH OR IMMEDIATELY FOLLOWING A MEAL. 90 tablet 1   nitroGLYCERIN  (NITROSTAT ) 0.4 MG SL tablet Dissolve 1 tablet under the tongue every 5 minutes as needed for chest pain. Max of 3 doses, then 911. 25 tablet 6   oxyCODONE -acetaminophen  (PERCOCET) 10-325 MG tablet Take 2 tablets by mouth every 6 (six) hours as needed for pain. 240 tablet 0   Pancrelipase , Lip-Prot-Amyl, (ZENPEP ) 40000-126000 units CPEP Take 2 capsules (80,000 Units total) by mouth with breakfast, with lunch, and with evening meal. 540 capsule 3   predniSONE  (DELTASONE ) 1 MG tablet TAKE 1 TABLET BY MOUTH DAILY AS NEEDED. 30 tablet 3   propranolol  (INDERAL ) 10 MG tablet Take 1 tablet by mouth up to four times daily as needed for palpitations 360 tablet 3   QUTENZA , 4 PATCH, 8 %      cephALEXin  (KEFLEX ) 500 MG capsule Take 1 capsule (500 mg total) by mouth 3 (three) times daily. (Patient not taking: Reported on 05/16/2024) 21 capsule 0   gabapentin  (NEURONTIN ) 100 MG capsule TAKE 1 CAPSULE (100 MG TOTAL) BY MOUTH 3 TIMES A DAY 270 capsule 0   No current facility-administered medications on file prior to visit.     Allergies  Allergen Reactions   Crestor [Rosuvastatin]     Social History   Socioeconomic History   Marital status: Married    Spouse name: Earnest   Number of children: 4   Years of education: Not on file   Highest education level: Not on file  Occupational History   Not on file  Tobacco Use   Smoking status: Every Day    Types: E-cigarettes    Last attempt to quit: 01/13/2009  Years since quitting: 15.3   Smokeless tobacco: Never  Vaping Use   Vaping status: Some Days   Substances: Nicotine  Substance and Sexual Activity   Alcohol use: No   Drug use: No   Sexual activity: Not Currently  Other Topics Concern   Not on file  Social History Narrative   Married in 1963.   4 grown children.   Social Drivers of Health   Tobacco Use: High Risk (05/20/2024)   Patient History    Smoking Tobacco Use: Every Day    Smokeless Tobacco Use: Never    Passive Exposure: Not on file  Financial Resource Strain: Low Risk (11/04/2021)   Overall Financial Resource Strain (CARDIA)    Difficulty of Paying Living Expenses: Not hard at all  Food Insecurity: No Food Insecurity (11/04/2021)   Hunger Vital Sign    Worried About Running Out of Food in the Last Year: Never true    Ran Out of Food in the Last Year: Never true  Transportation Needs: No Transportation Needs (11/04/2021)   PRAPARE - Administrator, Civil Service (Medical): No    Lack of Transportation (Non-Medical): No  Physical Activity: Insufficiently Active (11/04/2021)   Exercise Vital Sign    Days of Exercise per Week: 5 days    Minutes of Exercise per Session: 10 min  Stress: No Stress Concern Present (11/04/2021)   [employer] of Occupational Health - Occupational Stress Questionnaire    Feeling of Stress : Not at all  Social Connections: Moderately Integrated (11/04/2021)   Social Connection and Isolation Panel    Frequency of Communication with Friends and Family: More than three times a week    Frequency of Social Gatherings with Friends and Family: Once a week     Attends Religious Services: 1 to 4 times per year    Active Member of [employer] or Organizations: No    Attends Banker Meetings: Never    Marital Status: Married  Catering Manager Violence: Not At Risk (11/04/2021)   Humiliation, Afraid, Rape, and Kick questionnaire    Fear of Current or Ex-Partner: No    Emotionally Abused: No    Physically Abused: No    Sexually Abused: No  Depression (PHQ2-9): Low Risk (05/12/2024)   Depression (PHQ2-9)    PHQ-2 Score: 0  Alcohol Screen: Low Risk (11/04/2021)   Alcohol Screen    Last Alcohol Screening Score (AUDIT): 0  Housing: Low Risk (11/04/2021)   Housing    Last Housing Risk Score: 0  Utilities: Not on file  Health Literacy: Not on file     Review of Systems  All other systems reviewed and are negative.      Objective:   Physical Exam Vitals reviewed.  Constitutional:      General: She is not in acute distress.    Appearance: Normal appearance. She is normal weight. She is not ill-appearing or toxic-appearing.  Cardiovascular:     Rate and Rhythm: Normal rate and regular rhythm.     Pulses: Normal pulses.     Heart sounds: Normal heart sounds. No murmur heard.    No friction rub. No gallop.  Pulmonary:     Effort: Pulmonary effort is normal. No accessory muscle usage, respiratory distress or retractions.     Breath sounds: No stridor. No wheezing, rhonchi or rales.  Abdominal:     General: Bowel sounds are normal. There is no distension.     Palpations: Abdomen is soft. There is  no mass.     Tenderness: There is no abdominal tenderness. There is no guarding.  Musculoskeletal:     Right lower leg: No edema.     Left lower leg: No edema.  Skin:    Findings: Lesion present.  Neurological:     Mental Status: She is alert.        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  I anticipate that it will take  approximately 2 weeks for this to heal gradually as long as we continue to perform compression wraps. I covered the wounds and Silvadene and then cover them with a nonadherent gauze.  I then reapplied an Unna boot to the left leg.  I recommended the patient get zip up compression hose 15 to 20 mmHg.  Patient states that her compression hose that she has ordered will be in on Thursday.  I believe Thursday she can remove the Unna boot I placed her in today and switch to the zip up compression wraps that she has purchased.  Her daughter is a engineer, civil (consulting) and can help her put the compression wraps on.  She will then wear these on a daily basis until the wound heals.  She will contact me if there is any delay in healing or if it has not completely healed within 2 weeks.  Patient is comfortable with this plan.  She is free to come in anytime if she wants me to look at it again "

## 2024-05-20 NOTE — Telephone Encounter (Signed)
 Requested Prescriptions  Pending Prescriptions Disp Refills   cephALEXin  (KEFLEX ) 500 MG capsule [Pharmacy Med Name: CEPHALEXIN  500 MG CAPSULE] 21 capsule 0    Sig: TAKE 1 CAPSULE BY MOUTH THREE TIMES A DAY     Off-Protocol Failed - 05/20/2024  3:28 PM      Failed - Medication not assigned to a protocol, review manually.      Passed - Valid encounter within last 12 months    Recent Outpatient Visits           4 days ago Venous stasis ulcer of other part of left lower leg limited to breakdown of skin with varicose veins (HCC)   [place] [facility] Duanne Butler DASEN, MD   1 week ago Venous stasis ulcer of other part of left lower leg limited to breakdown of skin with varicose veins [facility])   [place] [name] Duanne Butler DASEN, MD   1 week ago Venous stasis ulcer of other part of left lower leg limited to breakdown of skin with varicose veins (HCC)   [facility] Aletha Bene, MD   2 weeks ago Venous stasis ulcer of other part of left lower leg limited to breakdown of skin with varicose veins [facility])   [place] [facility] Duanne Butler DASEN, MD   3 weeks ago Cellulitis of left lower extremity   [place] [facility] Pickard, Butler DASEN, MD               doxycycline  (VIBRA -TABS) 100 MG tablet [Pharmacy Med Name: DOXYCYCLINE  HYCLATE 100 MG TAB] 14 tablet 0    Sig: TAKE 1 TABLET BY MOUTH TWICE A DAY     Off-Protocol Failed - 05/20/2024  3:28 PM      Failed - Medication not assigned to a protocol, review manually.      Passed - Valid encounter within last 12 months    Recent Outpatient Visits           4 days ago Venous stasis ulcer of other part of left lower leg limited to breakdown of skin with varicose veins (HCC)   [place] [facility] Duanne Butler DASEN, MD   1 week ago Venous stasis ulcer of other part of left lower leg limited to breakdown of skin  with varicose veins [facility])   [place] [facility] Duanne Butler DASEN, MD   1 week ago Venous stasis ulcer of other part of left lower leg limited to breakdown of skin with varicose veins (HCC)   [place] [facility] Aletha Bene, MD   2 weeks ago Venous stasis ulcer of other part of left lower leg limited to breakdown of skin with varicose veins [facility])   [place] [facility] Duanne, Butler DASEN, MD   3 weeks ago Cellulitis of left lower extremity    [place] [facility] Pickard, Butler DASEN, MD               gabapentin  (NEURONTIN ) 100 MG capsule [Pharmacy Med Name: GABAPENTIN  100 MG CAPSULE] 270 capsule 0    Sig: TAKE 1 CAPSULE (100 MG TOTAL) BY MOUTH 3 TIMES A DAY     Neurology: Anticonvulsants - gabapentin  Passed - 05/20/2024  3:28 PM      Passed - Cr in normal range and within 360 days    Creat  Date Value Ref Range Status  01/04/2024 0.84 0.60 - 1.00 mg/dL Final         Passed - Completed PHQ-2 or PHQ-9 in the last 360 days      Passed - Valid encounter within last 12 months    Recent Outpatient Visits           4 days ago Venous stasis ulcer of other 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 Medicine Aletha Bene, MD   2 weeks ago Venous stasis ulcer of other part of left lower leg limited to breakdown of skin with varicose veins [facility])   [place] Duanne Butler DASEN, MD   3 weeks ago Cellulitis of left lower extremity   [place]  [facility] Pickard, Butler DASEN, MD

## 2024-05-20 NOTE — Telephone Encounter (Signed)
 Requested medication (s) are due for refill today - no  Requested medication (s) are on the active medication list -yes  Future visit scheduled -yes  Last refill: cephalexin -05/12/24 #21                 Doxycycline -04/29/24 #14  Notes to clinic: off protocol- provider review   Requested Prescriptions  Pending Prescriptions Disp Refills   cephALEXin  (KEFLEX ) 500 MG capsule [Pharmacy Med Name: CEPHALEXIN  500 MG CAPSULE] 21 capsule 0    Sig: TAKE 1 CAPSULE BY MOUTH THREE TIMES A DAY     Off-Protocol Failed - 05/20/2024  3:29 PM      Failed - Medication not assigned to a protocol, review manually.      Passed - Valid encounter within last 12 months    Recent Outpatient Visits           4 days ago Venous stasis ulcer of other part of left lower leg limited to breakdown of skin with varicose veins (HCC)   [place] [facility] Duanne Butler DASEN, MD   1 week ago Venous stasis ulcer of other part of left lower leg limited to breakdown of skin with varicose veins [facility])   [place] [facility] Duanne Butler DASEN, MD   1 week ago Venous stasis ulcer of other part of left lower leg limited to breakdown of skin with varicose veins (HCC)   [place] [facility] Medicine Aletha Bene, MD   2 weeks ago Venous stasis ulcer of other part of left lower leg limited to breakdown of skin with varicose veins [facility])   [place] [facility] Duanne Butler DASEN, MD   3 weeks ago Cellulitis of left lower extremity   [place] [facility] Pickard, Butler DASEN, MD               doxycycline  (VIBRA -TABS) 100 MG tablet [Pharmacy Med Name: DOXYCYCLINE  HYCLATE 100 MG TAB] 14 tablet 0    Sig: TAKE 1 TABLET BY MOUTH TWICE A DAY     Off-Protocol Failed - 05/20/2024  3:29 PM      Failed - Medication not assigned to a protocol, review manually.      Passed - Valid encounter within last 12 months    Recent Outpatient Visits            4 days ago Venous stasis ulcer of other part of left lower leg limited to breakdown of skin with varicose veins (HCC)   [place] [facility] Duanne Butler DASEN, MD   1 week ago Venous stasis ulcer of other part of left lower leg limited to breakdown of skin with varicose veins [facility])   [facility] Duanne Butler DASEN, MD   1 week ago Venous stasis ulcer of other part of left lower leg limited to breakdown of skin with varicose veins (HCC)   [place] [facility] Medicine Aletha Bene, MD   2 weeks ago Venous stasis ulcer of other part of left lower leg limited to breakdown of skin with varicose veins [facility])   [place] [facility] Duanne Butler DASEN, MD   3 weeks ago Cellulitis of left lower extremity   [place] [facility] Duanne Butler DASEN, MD              Signed Prescriptions Disp Refills   gabapentin  (NEURONTIN ) 100 MG capsule 270  capsule 0    Sig: TAKE 1 CAPSULE (100 MG TOTAL) BY MOUTH 3 TIMES A DAY     Neurology: Anticonvulsants - gabapentin  Passed - 05/20/2024  3:29 PM      Passed - Cr in normal range and within 360 days    Creat  Date Value Ref Range Status  01/04/2024 0.84 0.60 - 1.00 mg/dL Final         Passed - Completed PHQ-2 or PHQ-9 in the last 360 days      Passed - Valid encounter within last 12 months    Recent Outpatient Visits           4 days ago Venous stasis ulcer of other part of left lower leg limited to breakdown of skin with varicose veins (HCC)   [place] [facility] Duanne Butler DASEN, MD   1 week ago Venous stasis ulcer of other part of left lower leg limited to breakdown of skin with varicose veins (HCC)   [place] [facility] Duanne Butler DASEN, MD   1 week ago Venous stasis ulcer of other part of left lower leg limited to breakdown of skin with varicose veins (HCC)   [place] [facility]  Aletha Bene, MD   2 weeks ago Venous stasis ulcer of other part of left lower leg limited to breakdown of skin with varicose veins [facility])   [facility] Duanne Butler DASEN, MD   3 weeks ago Cellulitis of left lower extremity   [place] [facility] Duanne Butler DASEN, MD                 Requested Prescriptions  Pending Prescriptions Disp Refills   cephALEXin  (KEFLEX ) 500 MG capsule [Pharmacy Med Name: CEPHALEXIN  500 MG CAPSULE] 21 capsule 0    Sig: TAKE 1 CAPSULE BY MOUTH THREE TIMES A DAY     Off-Protocol Failed - 05/20/2024  3:29 PM      Failed - Medication not assigned to a protocol, review manually.      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 (HCC)   [place] [facility] Aletha Bene, MD   2 weeks ago Venous stasis ulcer of other part of left lower leg limited to breakdown of skin with varicose veins [facility])   [place] [facility] Duanne Butler DASEN, MD   3 weeks ago Cellulitis of left lower extremity   [place] [facility] Duanne, Butler DASEN, MD               doxycycline  (VIBRA -TABS) 100 MG tablet [Pharmacy Med Name: DOXYCYCLINE  HYCLATE 100 MG TAB] 14 tablet 0    Sig: TAKE 1 TABLET BY MOUTH TWICE A DAY     Off-Protocol Failed - 05/20/2024  3:29  PM      Failed - Medication not assigned to a protocol, review manually.      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 (HCC)   [place] [facility] Duanne Butler DASEN, MD   1 week ago Venous stasis ulcer of other part of left lower leg limited to breakdown of skin with varicose veins [facility])   [place] [facility] Duanne Butler DASEN, MD   1 week ago Venous stasis ulcer of other part of left lower leg limited to breakdown of skin with varicose veins (HCC)   [place] [facility] Aletha Bene, MD   2 weeks ago Venous stasis ulcer of other part of left lower leg limited to breakdown of skin with varicose veins [facility])   [place] [facility] Duanne Butler DASEN, MD   3 weeks ago Cellulitis of left lower extremity   [place] [facility] Pickard, Butler DASEN, MD              Signed Prescriptions Disp Refills   gabapentin  (NEURONTIN ) 100 MG capsule 270 capsule 0    Sig: TAKE 1 CAPSULE (100 MG TOTAL) BY MOUTH 3 TIMES A DAY     Neurology: Anticonvulsants - gabapentin  Passed - 05/20/2024  3:29 PM      Passed - Cr in normal range and within 360 days    Creat  Date Value Ref Range Status  01/04/2024 0.84 0.60 - 1.00 mg/dL Final         Passed - Completed PHQ-2 or PHQ-9 in the last 360 days      Passed - Valid encounter within last 12 months    Recent Outpatient Visits           4 days ago Venous stasis ulcer of other part of left lower leg limited to breakdown of skin with varicose veins (HCC)    [place] [facility] Duanne Butler DASEN, MD   1 week ago Venous stasis ulcer of other part of left lower leg limited to breakdown of skin with varicose veins [facility])   [place] [facility] Duanne Butler DASEN, MD   1 week ago Venous stasis ulcer of other part of left lower leg limited to breakdown of skin with  varicose veins (HCC)   [place] [facility] Aletha Bene, MD   2 weeks ago Venous stasis ulcer of other part of left lower leg limited to breakdown of skin with varicose veins [facility])   [place] [facility] Duanne Butler DASEN, MD   3 weeks ago Cellulitis of left lower extremity   [place] [facility] Pickard, Butler DASEN, MD

## 2024-05-21 ENCOUNTER — Other Ambulatory Visit: Payer: Self-pay

## 2024-05-21 ENCOUNTER — Other Ambulatory Visit: Payer: Self-pay | Admitting: Family Medicine

## 2024-05-21 MED ORDER — CLONIDINE HCL 0.1 MG PO TABS: 0.1000 mg | ORAL_TABLET | Freq: Two times a day (BID) | ORAL | 3 refills | Status: AC

## 2024-05-22 NOTE — Progress Notes (Signed)
 Jasmin [name]                                          MRN: [id number]   05/22/2024   The [facility] Quality Team Specialist reviewed this patient medical record for the purposes of chart review for care gap closure. The following were reviewed: chart review for care gap closure-controlling blood pressure.    VBCI Quality Team

## 2024-05-23 ENCOUNTER — Other Ambulatory Visit: Payer: Self-pay | Admitting: Physical Medicine and Rehabilitation

## 2024-05-23 ENCOUNTER — Telehealth: Payer: Self-pay

## 2024-05-23 DIAGNOSIS — G894 Chronic pain syndrome: Secondary | ICD-10-CM

## 2024-05-23 DIAGNOSIS — M961 Postlaminectomy syndrome, not elsewhere classified: Secondary | ICD-10-CM

## 2024-05-23 MED ORDER — OXYCODONE-ACETAMINOPHEN 10-325 MG PO TABS: 2.0000 | ORAL_TABLET | Freq: Four times a day (QID) | ORAL | 0 refills | Status: AC | PRN

## 2024-05-23 NOTE — Telephone Encounter (Signed)
 Patient left a voicemail requesting a refill on Oxycodone .  Message to be sent to Provider for review

## 2024-06-16 ENCOUNTER — Other Ambulatory Visit: Payer: Self-pay | Admitting: Physical Medicine and Rehabilitation

## 2024-06-30 ENCOUNTER — Encounter: Admitting: Physical Medicine and Rehabilitation
